# Patient Record
Sex: Male | Born: 1945 | Race: Black or African American | Hispanic: No | Marital: Married | Smoking: Never smoker
Health system: Southern US, Community
[De-identification: ages and names within clinical notes are randomized; demographics above are authoritative.]

## PROBLEM LIST (undated history)

## (undated) DIAGNOSIS — I10 Essential (primary) hypertension: Secondary | ICD-10-CM

## (undated) DIAGNOSIS — K219 Gastro-esophageal reflux disease without esophagitis: Secondary | ICD-10-CM

## (undated) DIAGNOSIS — Z9119 Patient's noncompliance with other medical treatment and regimen: Secondary | ICD-10-CM

## (undated) DIAGNOSIS — M069 Rheumatoid arthritis, unspecified: Secondary | ICD-10-CM

## (undated) DIAGNOSIS — J40 Bronchitis, not specified as acute or chronic: Secondary | ICD-10-CM

## (undated) DIAGNOSIS — Z91199 Patient's noncompliance with other medical treatment and regimen due to unspecified reason: Secondary | ICD-10-CM

## (undated) HISTORY — PX: ABDOMINAL SURGERY: SHX537

---

## 2000-11-15 ENCOUNTER — Emergency Department (HOSPITAL_COMMUNITY): Admission: EM | Admit: 2000-11-15 | Discharge: 2000-11-15 | Payer: Self-pay | Admitting: *Deleted

## 2001-05-21 ENCOUNTER — Emergency Department (HOSPITAL_COMMUNITY): Admission: EM | Admit: 2001-05-21 | Discharge: 2001-05-21 | Payer: Self-pay | Admitting: Emergency Medicine

## 2001-05-24 ENCOUNTER — Encounter: Payer: Self-pay | Admitting: Family Medicine

## 2001-05-24 ENCOUNTER — Ambulatory Visit (HOSPITAL_COMMUNITY): Admission: RE | Admit: 2001-05-24 | Discharge: 2001-05-24 | Payer: Self-pay | Admitting: Family Medicine

## 2001-07-06 ENCOUNTER — Emergency Department (HOSPITAL_COMMUNITY): Admission: EM | Admit: 2001-07-06 | Discharge: 2001-07-06 | Payer: Self-pay | Admitting: Emergency Medicine

## 2001-07-07 ENCOUNTER — Emergency Department (HOSPITAL_COMMUNITY): Admission: EM | Admit: 2001-07-07 | Discharge: 2001-07-07 | Payer: Self-pay | Admitting: *Deleted

## 2001-09-07 ENCOUNTER — Encounter: Payer: Self-pay | Admitting: Emergency Medicine

## 2001-09-07 ENCOUNTER — Emergency Department (HOSPITAL_COMMUNITY): Admission: EM | Admit: 2001-09-07 | Discharge: 2001-09-07 | Payer: Self-pay | Admitting: Emergency Medicine

## 2001-11-09 ENCOUNTER — Ambulatory Visit (HOSPITAL_COMMUNITY): Admission: RE | Admit: 2001-11-09 | Discharge: 2001-11-09 | Payer: Self-pay | Admitting: Family Medicine

## 2001-11-09 ENCOUNTER — Encounter: Payer: Self-pay | Admitting: Family Medicine

## 2002-02-23 ENCOUNTER — Emergency Department (HOSPITAL_COMMUNITY): Admission: EM | Admit: 2002-02-23 | Discharge: 2002-02-23 | Payer: Self-pay | Admitting: Internal Medicine

## 2002-02-23 ENCOUNTER — Encounter: Payer: Self-pay | Admitting: Internal Medicine

## 2002-03-21 ENCOUNTER — Encounter (HOSPITAL_COMMUNITY): Admission: RE | Admit: 2002-03-21 | Discharge: 2002-04-20 | Payer: Self-pay | Admitting: Internal Medicine

## 2002-03-21 ENCOUNTER — Encounter: Payer: Self-pay | Admitting: Internal Medicine

## 2002-11-02 ENCOUNTER — Ambulatory Visit (HOSPITAL_COMMUNITY): Admission: RE | Admit: 2002-11-02 | Discharge: 2002-11-02 | Payer: Self-pay | Admitting: Family Medicine

## 2002-11-02 ENCOUNTER — Encounter: Payer: Self-pay | Admitting: Family Medicine

## 2002-12-22 ENCOUNTER — Emergency Department (HOSPITAL_COMMUNITY): Admission: EM | Admit: 2002-12-22 | Discharge: 2002-12-22 | Payer: Self-pay | Admitting: Emergency Medicine

## 2003-02-04 ENCOUNTER — Ambulatory Visit (HOSPITAL_COMMUNITY): Admission: RE | Admit: 2003-02-04 | Discharge: 2003-02-04 | Payer: Self-pay | Admitting: Family Medicine

## 2003-03-04 ENCOUNTER — Ambulatory Visit (HOSPITAL_COMMUNITY): Admission: RE | Admit: 2003-03-04 | Discharge: 2003-03-04 | Payer: Self-pay | Admitting: Family Medicine

## 2004-08-02 ENCOUNTER — Emergency Department (HOSPITAL_COMMUNITY): Admission: EM | Admit: 2004-08-02 | Discharge: 2004-08-03 | Payer: Self-pay | Admitting: Emergency Medicine

## 2005-07-13 ENCOUNTER — Ambulatory Visit (HOSPITAL_COMMUNITY): Admission: RE | Admit: 2005-07-13 | Discharge: 2005-07-13 | Payer: Self-pay | Admitting: Orthopaedic Surgery

## 2005-07-22 ENCOUNTER — Ambulatory Visit (HOSPITAL_COMMUNITY): Admission: RE | Admit: 2005-07-22 | Discharge: 2005-07-22 | Payer: Self-pay | Admitting: Orthopaedic Surgery

## 2005-07-22 ENCOUNTER — Encounter (INDEPENDENT_AMBULATORY_CARE_PROVIDER_SITE_OTHER): Payer: Self-pay | Admitting: Specialist

## 2007-01-06 ENCOUNTER — Ambulatory Visit (HOSPITAL_COMMUNITY): Admission: RE | Admit: 2007-01-06 | Discharge: 2007-01-06 | Payer: Self-pay | Admitting: General Surgery

## 2007-07-30 ENCOUNTER — Emergency Department (HOSPITAL_COMMUNITY): Admission: EM | Admit: 2007-07-30 | Discharge: 2007-07-30 | Payer: Self-pay | Admitting: Emergency Medicine

## 2007-10-02 ENCOUNTER — Emergency Department (HOSPITAL_COMMUNITY): Admission: EM | Admit: 2007-10-02 | Discharge: 2007-10-02 | Payer: Self-pay | Admitting: Emergency Medicine

## 2008-07-05 ENCOUNTER — Emergency Department (HOSPITAL_COMMUNITY): Admission: EM | Admit: 2008-07-05 | Discharge: 2008-07-06 | Payer: Self-pay | Admitting: Emergency Medicine

## 2008-10-28 ENCOUNTER — Ambulatory Visit (HOSPITAL_COMMUNITY): Admission: RE | Admit: 2008-10-28 | Discharge: 2008-10-28 | Payer: Self-pay | Admitting: General Surgery

## 2008-10-28 ENCOUNTER — Encounter (INDEPENDENT_AMBULATORY_CARE_PROVIDER_SITE_OTHER): Payer: Self-pay | Admitting: General Surgery

## 2008-11-09 ENCOUNTER — Emergency Department (HOSPITAL_COMMUNITY): Admission: EM | Admit: 2008-11-09 | Discharge: 2008-11-09 | Payer: Self-pay | Admitting: Emergency Medicine

## 2008-11-21 ENCOUNTER — Emergency Department (HOSPITAL_COMMUNITY): Admission: EM | Admit: 2008-11-21 | Discharge: 2008-11-21 | Payer: Self-pay | Admitting: Emergency Medicine

## 2009-05-03 ENCOUNTER — Emergency Department (HOSPITAL_COMMUNITY): Admission: EM | Admit: 2009-05-03 | Discharge: 2009-05-03 | Payer: Self-pay | Admitting: Emergency Medicine

## 2009-05-07 ENCOUNTER — Emergency Department (HOSPITAL_COMMUNITY): Admission: EM | Admit: 2009-05-07 | Discharge: 2009-05-07 | Payer: Self-pay | Admitting: Emergency Medicine

## 2009-05-09 ENCOUNTER — Ambulatory Visit (HOSPITAL_COMMUNITY): Admission: RE | Admit: 2009-05-09 | Discharge: 2009-05-09 | Payer: Self-pay | Admitting: Family Medicine

## 2009-05-23 ENCOUNTER — Emergency Department (HOSPITAL_COMMUNITY): Admission: EM | Admit: 2009-05-23 | Discharge: 2009-05-23 | Payer: Self-pay | Admitting: Emergency Medicine

## 2010-02-13 ENCOUNTER — Emergency Department (HOSPITAL_COMMUNITY): Admission: EM | Admit: 2010-02-13 | Discharge: 2010-02-13 | Payer: Self-pay | Admitting: Emergency Medicine

## 2010-02-17 ENCOUNTER — Ambulatory Visit: Payer: Self-pay | Admitting: Cardiology

## 2010-02-17 ENCOUNTER — Observation Stay (HOSPITAL_COMMUNITY)
Admission: AD | Admit: 2010-02-17 | Discharge: 2010-02-19 | Payer: Self-pay | Source: Home / Self Care | Admitting: Family Medicine

## 2010-02-18 ENCOUNTER — Encounter (INDEPENDENT_AMBULATORY_CARE_PROVIDER_SITE_OTHER): Payer: Self-pay | Admitting: Family Medicine

## 2010-06-01 LAB — GLUCOSE, CAPILLARY
Glucose-Capillary: 128 mg/dL — ABNORMAL HIGH (ref 70–99)
Glucose-Capillary: 184 mg/dL — ABNORMAL HIGH (ref 70–99)
Glucose-Capillary: 190 mg/dL — ABNORMAL HIGH (ref 70–99)

## 2010-06-01 LAB — LIPID PANEL
Cholesterol: 166 mg/dL (ref 0–200)
HDL: 41 mg/dL (ref >39)
LDL Cholesterol: 106 mg/dL — ABNORMAL HIGH (ref 0–99)
Total CHOL/HDL Ratio: 4 RATIO
Triglycerides: 93 mg/dL (ref <150)
VLDL: 19 mg/dL (ref 0–40)

## 2010-06-02 LAB — DIFFERENTIAL
Basophils Absolute: 0 10*3/uL (ref 0.0–0.1)
Basophils Absolute: 0 10*3/uL (ref 0.0–0.1)
Basophils Relative: 0 % (ref 0–1)
Eosinophils Absolute: 0.1 10*3/uL (ref 0.0–0.7)
Eosinophils Absolute: 0.1 10*3/uL (ref 0.0–0.7)
Eosinophils Relative: 2 % (ref 0–5)
Eosinophils Relative: 3 % (ref 0–5)
Lymphocytes Relative: 32 % (ref 12–46)
Lymphocytes Relative: 40 % (ref 12–46)
Lymphs Abs: 1.6 10*3/uL (ref 0.7–4.0)
Lymphs Abs: 2 10*3/uL (ref 0.7–4.0)
Monocytes Absolute: 0.3 10*3/uL (ref 0.1–1.0)
Monocytes Absolute: 0.3 10*3/uL (ref 0.1–1.0)
Monocytes Relative: 7 % (ref 3–12)
Neutro Abs: 2.8 10*3/uL (ref 1.7–7.7)
Neutrophils Relative %: 58 % (ref 43–77)

## 2010-06-02 LAB — URINE CULTURE
Culture  Setup Time: 201111301230
Special Requests: POSITIVE

## 2010-06-02 LAB — CK TOTAL AND CKMB (NOT AT ARMC)
CK, MB: 1.5 ng/mL (ref 0.3–4.0)
CK, MB: 1.7 ng/mL (ref 0.3–4.0)
Relative Index: 1.2 (ref 0.0–2.5)
Relative Index: 1.3 (ref 0.0–2.5)
Total CK: 121 U/L (ref 7–232)
Total CK: 128 U/L (ref 7–232)

## 2010-06-02 LAB — CBC
HCT: 38.5 % — ABNORMAL LOW (ref 39.0–52.0)
Hemoglobin: 13.5 g/dL (ref 13.0–17.0)
Hemoglobin: 13.7 g/dL (ref 13.0–17.0)
MCH: 27 pg (ref 26.0–34.0)
MCH: 27.2 pg (ref 26.0–34.0)
MCHC: 35.6 g/dL (ref 30.0–36.0)
MCV: 76.5 fL — ABNORMAL LOW (ref 78.0–100.0)
Platelets: 196 10*3/uL (ref 150–400)
Platelets: 198 10*3/uL (ref 150–400)
RBC: 5 MIL/uL (ref 4.22–5.81)
RBC: 5.03 MIL/uL (ref 4.22–5.81)
RDW: 15 % (ref 11.5–15.5)
WBC: 4.8 10*3/uL (ref 4.0–10.5)
WBC: 4.9 10*3/uL (ref 4.0–10.5)

## 2010-06-02 LAB — TROPONIN I
Troponin I: 0.01 ng/mL (ref 0.00–0.06)
Troponin I: <0.01 ng/mL (ref 0.00–0.06)

## 2010-06-02 LAB — BASIC METABOLIC PANEL
BUN: 19 mg/dL (ref 6–23)
CO2: 27 mEq/L (ref 19–32)
Calcium: 9.6 mg/dL (ref 8.4–10.5)
Chloride: 104 mEq/L (ref 96–112)
Creatinine, Ser: 0.89 mg/dL (ref 0.4–1.5)
GFR calc Af Amer: >60 mL/min (ref >60)
GFR calc non Af Amer: >60 mL/min (ref >60)
Glucose, Bld: 224 mg/dL — ABNORMAL HIGH (ref 70–99)
Potassium: 4.2 mEq/L (ref 3.5–5.1)
Sodium: 135 mEq/L (ref 135–145)

## 2010-06-02 LAB — URINALYSIS, ROUTINE W REFLEX MICROSCOPIC
Bilirubin Urine: NEGATIVE
Glucose, UA: NEGATIVE mg/dL
Hgb urine dipstick: NEGATIVE
Ketones, ur: NEGATIVE mg/dL
Leukocytes, UA: NEGATIVE
Nitrite: POSITIVE — AB
Protein, ur: NEGATIVE mg/dL
Specific Gravity, Urine: 1.025 (ref 1.005–1.030)
Urobilinogen, UA: 0.2 mg/dL (ref 0.0–1.0)
pH: 5 (ref 5.0–8.0)

## 2010-06-02 LAB — CARDIAC PANEL(CRET KIN+CKTOT+MB+TROPI)
CK, MB: 1.2 ng/mL (ref 0.3–4.0)
CK, MB: 1.4 ng/mL (ref 0.3–4.0)
Relative Index: 1.3 (ref 0.0–2.5)
Relative Index: INVALID (ref 0.0–2.5)
Total CK: 106 U/L (ref 7–232)
Troponin I: <0.01 ng/mL (ref 0.00–0.06)
Troponin I: <0.01 ng/mL (ref 0.00–0.06)

## 2010-06-02 LAB — GLUCOSE, CAPILLARY
Glucose-Capillary: 262 mg/dL — ABNORMAL HIGH (ref 70–99)
Glucose-Capillary: 84 mg/dL (ref 70–99)

## 2010-06-02 LAB — COMPREHENSIVE METABOLIC PANEL
ALT: 14 U/L (ref 0–53)
AST: 17 U/L (ref 0–37)
Albumin: 4.1 g/dL (ref 3.5–5.2)
CO2: 30 mEq/L (ref 19–32)
Chloride: 101 mEq/L (ref 96–112)
Creatinine, Ser: 0.97 mg/dL (ref 0.4–1.5)
GFR calc Af Amer: >60 mL/min (ref >60)
Sodium: 140 mEq/L (ref 135–145)
Total Bilirubin: 1 mg/dL (ref 0.3–1.2)

## 2010-06-02 LAB — URINE MICROSCOPIC-ADD ON

## 2010-06-02 LAB — D-DIMER, QUANTITATIVE: D-Dimer, Quant: 0.22 ug/mL-FEU (ref 0.00–0.48)

## 2010-06-10 LAB — BASIC METABOLIC PANEL
BUN: 13 mg/dL (ref 6–23)
BUN: 17 mg/dL (ref 6–23)
Creatinine, Ser: 1.09 mg/dL (ref 0.4–1.5)
GFR calc Af Amer: >60 mL/min (ref >60)
GFR calc non Af Amer: >60 mL/min (ref >60)
GFR calc non Af Amer: >60 mL/min (ref >60)
Potassium: 3.6 mEq/L (ref 3.5–5.1)
Potassium: 3.9 mEq/L (ref 3.5–5.1)
Sodium: 140 mEq/L (ref 135–145)

## 2010-06-10 LAB — CBC
HCT: 39.4 % (ref 39.0–52.0)
Hemoglobin: 13.2 g/dL (ref 13.0–17.0)
Platelets: 184 10*3/uL (ref 150–400)
Platelets: 234 10*3/uL (ref 150–400)
RBC: 4.81 MIL/uL (ref 4.22–5.81)
WBC: 5.3 10*3/uL (ref 4.0–10.5)
WBC: 7.5 10*3/uL (ref 4.0–10.5)

## 2010-06-10 LAB — DIFFERENTIAL
Eosinophils Absolute: 0.5 10*3/uL (ref 0.0–0.7)
Lymphocytes Relative: 29 % (ref 12–46)
Lymphs Abs: 2.1 10*3/uL (ref 0.7–4.0)
Lymphs Abs: 2.1 10*3/uL (ref 0.7–4.0)
Neutro Abs: 2.4 10*3/uL (ref 1.7–7.7)
Neutro Abs: 3.5 10*3/uL (ref 1.7–7.7)
Neutrophils Relative %: 44 % (ref 43–77)
Neutrophils Relative %: 47 % (ref 43–77)

## 2010-06-10 LAB — CK TOTAL AND CKMB (NOT AT ARMC): Relative Index: 0.7 (ref 0.0–2.5)

## 2010-06-10 LAB — URINALYSIS, ROUTINE W REFLEX MICROSCOPIC
Ketones, ur: NEGATIVE mg/dL
Nitrite: NEGATIVE
Protein, ur: NEGATIVE mg/dL
pH: 5 (ref 5.0–8.0)

## 2010-06-10 LAB — BRAIN NATRIURETIC PEPTIDE: Pro B Natriuretic peptide (BNP): <30 pg/mL (ref 0.0–100.0)

## 2010-06-10 LAB — TROPONIN I: Troponin I: 0.02 ng/mL (ref 0.00–0.06)

## 2010-06-14 LAB — COMPREHENSIVE METABOLIC PANEL WITH GFR
ALT: 14 U/L (ref 0–53)
AST: 16 U/L (ref 0–37)
Albumin: 3.6 g/dL (ref 3.5–5.2)
Chloride: 103 meq/L (ref 96–112)
Creatinine, Ser: 0.93 mg/dL (ref 0.4–1.5)
GFR calc Af Amer: >60 mL/min (ref >60)
Sodium: 142 meq/L (ref 135–145)
Total Bilirubin: 0.6 mg/dL (ref 0.3–1.2)

## 2010-06-14 LAB — CBC
HCT: 39.7 % (ref 39.0–52.0)
Hemoglobin: 13.5 g/dL (ref 13.0–17.0)
MCHC: 34 g/dL (ref 30.0–36.0)
MCV: 84.2 fL (ref 78.0–100.0)
Platelets: 200 K/uL (ref 150–400)
RBC: 4.72 MIL/uL (ref 4.22–5.81)
RDW: 16 % — ABNORMAL HIGH (ref 11.5–15.5)
WBC: 7.7 10*3/uL (ref 4.0–10.5)

## 2010-06-14 LAB — COMPREHENSIVE METABOLIC PANEL
Alkaline Phosphatase: 80 U/L (ref 39–117)
BUN: 18 mg/dL (ref 6–23)
CO2: 31 mEq/L (ref 19–32)
Calcium: 8.8 mg/dL (ref 8.4–10.5)
GFR calc non Af Amer: >60 mL/min (ref >60)
Glucose, Bld: 177 mg/dL — ABNORMAL HIGH (ref 70–99)
Potassium: 3.4 mEq/L — ABNORMAL LOW (ref 3.5–5.1)
Total Protein: 6.1 g/dL (ref 6.0–8.3)

## 2010-06-14 LAB — DIFFERENTIAL
Basophils Absolute: 0 K/uL (ref 0.0–0.1)
Basophils Relative: 1 % (ref 0–1)
Eosinophils Absolute: 0.1 10*3/uL (ref 0.0–0.7)
Eosinophils Relative: 1 % (ref 0–5)
Lymphocytes Relative: 27 % (ref 12–46)
Lymphs Abs: 2.1 K/uL (ref 0.7–4.0)
Monocytes Absolute: 0.5 K/uL (ref 0.1–1.0)
Monocytes Relative: 6 % (ref 3–12)
Neutro Abs: 5 10*3/uL (ref 1.7–7.7)
Neutrophils Relative %: 65 % (ref 43–77)

## 2010-06-26 LAB — SYNOVIAL CELL COUNT + DIFF, W/ CRYSTALS
Eosinophils-Synovial: 0 % (ref 0–1)
Monocyte-Macrophage-Synovial Fluid: 14 % — ABNORMAL LOW (ref 50–90)
Neutrophil, Synovial: 74 % — ABNORMAL HIGH (ref 0–25)
WBC, Synovial: 6900 /mm3 — ABNORMAL HIGH (ref 0–200)

## 2010-06-26 LAB — GRAM STAIN

## 2010-06-27 LAB — BASIC METABOLIC PANEL
Calcium: 9.4 mg/dL (ref 8.4–10.5)
Creatinine, Ser: 0.86 mg/dL (ref 0.4–1.5)
GFR calc Af Amer: >60 mL/min (ref >60)

## 2010-06-27 LAB — GLUCOSE, CAPILLARY
Glucose-Capillary: 173 mg/dL — ABNORMAL HIGH (ref 70–99)
Glucose-Capillary: 59 mg/dL — ABNORMAL LOW (ref 70–99)
Glucose-Capillary: 63 mg/dL — ABNORMAL LOW (ref 70–99)

## 2010-06-27 LAB — CBC
MCHC: 34.1 g/dL (ref 30.0–36.0)
RBC: 4.62 MIL/uL (ref 4.22–5.81)
WBC: 4.7 10*3/uL (ref 4.0–10.5)

## 2010-08-04 NOTE — H&P (Signed)
NAME:  [name], [name] NO.:  [id number]   MEDICAL RECORD NO.:  [id number]          PATIENT TYPE:  AMB   LOCATION:  DAY                           FACILITY:  [facility]   PHYSICIAN:  Dalia Heading, M.D.  DATE OF BIRTH:  November 20, 1945   DATE OF ADMISSION:  DATE OF DISCHARGE:  LH                              HISTORY & PHYSICAL   CHIEF COMPLAINT:  Umbilical hernia.   HISTORY OF PRESENT ILLNESS:  The patient is a 65 year old black male who  was referred for evaluation and treatment of an umbilical hernia. It has  been present for some time, the periumbilical pain is made worse with  straining.  No nausea or vomiting have been noted.   PAST MEDICAL HISTORY:  Non-insulin-dependent diabetes mellitus,  hypertension, asthma, mild obesity.   PAST SURGICAL HISTORY:  Excision of mass, scalp in 2000, knee surgery in  February 2000, tonsillectomy in the remote past.   CURRENT MEDICATIONS:  Glipizide, Percocet, enalapril, Caduet, Nexium,  baby aspirin, Klor-Con, Januvia, metformin.   ALLERGIES:  No known drug allergies.   REVIEW OF SYSTEMS:  The patient denies drinking or smoking.  He denies  any recent chest pain, MI, CVA, or bleeding disorders.   PHYSICAL EXAMINATION:  The patient is a well-developed, well-nourished  black male in no acute distress.  LUNGS:  Clear to auscultation with equal breath sounds bilaterally.  HEART:  Examination reveals a regular rate and rhythm without S3,  S4,  or murmurs.  ABDOMEN:  The abdomen is soft and nondistended.  Tender in the umbilicus  with a small hernia present.  It is easily reducible.  No  hepatosplenomegaly or masses are noted.   IMPRESSION:  1. Umbilical hernia.  2. Hypertension.  3. Non insulin-dependent diabetes mellitus.  4. Reflux disease.  5. Asthma.   PLAN:  The patient is scheduled to undergo an umbilical herniorrhaphy on  January 06, 2007.  The risks and benefits of the procedure including  bleeding, infection, and  the possibility of recurrence of the hernia  were fully explained to the patient, who gave informed consent.      Dalia Heading, M.D.  Electronically Signed     MAJ/MEDQ  D:  12/22/2006  T:  12/23/2006  Job:  [id number]   cc:   Mila Homer. Sudie Bailey, M.D.  Fax: [phone]

## 2010-08-04 NOTE — Op Note (Signed)
NAME:  [name], [name] NO.:  [id number]   MEDICAL RECORD NO.:  [id number]          PATIENT TYPE:  AMB   LOCATION:  DAY                           FACILITY:  [facility]   PHYSICIAN:  Tilford Pillar, MD      DATE OF BIRTH:  1945/07/19   DATE OF PROCEDURE:  10/28/2008  DATE OF DISCHARGE:  10/28/2008                               OPERATIVE REPORT   PREOPERATIVE DIAGNOSIS:  Lipoma of left occipital scalp.   POSTOPERATIVE DIAGNOSIS:  Lipoma of left occipital scalp.   PROCEDURE:  Excision of lipoma via a 2 cm incision.   SURGEON:  Tilford Pillar, MD   ANESTHESIA:  General endotracheal, local anesthetic 0.5% Sensorcaine  plain.   SPECIMEN:  Lipoma.   ESTIMATED BLOOD LOSS:  Less than 100 mL.   COMPLICATIONS:  None.   INDICATIONS:  The patient is a 65 year old male, who presented to my  office on referral with the soft tissue lump on the posterior aspect of  his head.  This has been slowly increasing in size.  He had had a  previous scalp lipoma excised in the past by my partner.  On evaluation,  this was suggestive of a lipoma.  The risks, benefits, and alternatives  of excision were discussed at length with the patient including, but not  limited to the risk of bleeding and infection as well as recurrence.  The patient's questions and concerns were addressed and the patient was  consented for the planned procedure.   OPERATION:  The patient taken to the operating room and was placed in  supine position on the operating table at which time the general  anesthetic was administered.  The patient was asleep.  He is  endotracheally intubated by anesthesia.  At this point, his abdomen has  been repositioned into a right lateral decubitus position.  His scalp  was prepped with Betadine solution and draped in standard fashion.  A  local anesthetic was instilled.  A elliptical skin incision was created  over the palpable soft tissue mass.  Additional dissection down through  the subcuticular tissues carried out using electrocautery using the  needle tip Bovie.  At this time, the soft tissue masses dissected free,  circumferentially as placed on the back table, and sent as a permanent  specimen to pathology.  At this time, hemostasis was obtained using  combination of electrocautery as well as a 3-0 Vicryl suture ligature.  The wound was irrigated.  A 3-0 Vicryl was then utilized to  reapproximate deep to the subcuticular tissue and a 4-0 Monocryl was  utilized to reapproximate the skin in a running subcuticular suture.  The skin was washed and dried with moist dry towel.  Benzoin was applied  around the incision.  Half inch Steri-Strips were placed.  The drapes  were removed.  The patient was positioned back into a supine position of  adequate amount of general anesthetic.  He was extubated  and transferred back to regular hospital bed and transferred to the Post-  Anesthetic Care Unit in stable condition.  At the conclusion of the  procedure,  all instrument, sponge, and needle counts were correct.  The  patient tolerated the procedure extremely well.      Tilford Pillar, MD  Electronically Signed     BZ/MEDQ  D:  10/28/2008  T:  10/29/2008  Job:  [id number]

## 2010-08-04 NOTE — Op Note (Signed)
NAME:  [name], [name] NO.:  [id number]   MEDICAL RECORD NO.:  [id number]          PATIENT TYPE:  AMB   LOCATION:  DAY                           FACILITY:  [facility]   PHYSICIAN:  Dalia Heading, M.D.  DATE OF BIRTH:  [birth year]   DATE OF PROCEDURE:  01/06/2007  DATE OF DISCHARGE:                               OPERATIVE REPORT   PREOPERATIVE DIAGNOSIS:  Umbilical hernia.   POSTOPERATIVE DIAGNOSIS:  Umbilical hernia.   PROCEDURE:  Umbilical herniorrhaphy with mesh.   SURGEON:  Dalia Heading, M.D.   ANESTHESIA:  General.   INDICATIONS:  The patient is a 65 year old black male who presents with  a symptomatic umbilical hernia.  The risks and benefits of the procedure  including bleeding, infection, and the possibility of recurrence of the  hernia were fully explained to the patient who gave informed consent.   PROCEDURE NOTE:  The patient was placed in the supine position.  After  general anesthesia was administered due to a failed spinal anesthetic,  the abdomen was prepped and draped using the usual sterile technique  with Betadine.  Surgical site confirmation was performed.   An infraumbilical incision was made down to the fascia.  The umbilicus  was freed away from the underlying fascia.  A 1-cm hernia defect was  found.  A small-sized Ventralex mesh patch was then placed in this  region and secured to the fascia using 2-0 Novofil interrupted sutures.  The fascial edges on the lateral aspects were reapproximated using a 2-0  Novofil interrupted suture.  The base of the umbilicus was secured back  to the fascia using a 2-0 Vicryl interrupted suture.  The subcutaneous  layer was reapproximated using a 3-0 Vicryl interrupted suture.  The  skin was closed using staples.  0.5% Sensorcaine was instilled into the  surrounding wound.  Betadine ointment and dry sterile dressings were  applied.   All tape and needle counts were correct at the end of the procedure.  The patient was awakened and transferred to the PACU in stable  condition.   COMPLICATIONS:  None.   SPECIMEN:  None.   BLOOD LOSS:  Minimal.      Dalia Heading, M.D.  Electronically Signed     MAJ/MEDQ  D:  01/06/2007  T:  01/08/2007  Job:  [id number]   cc:   Mila Homer. Sudie Bailey, M.D.  Fax: [phone]

## 2010-08-04 NOTE — H&P (Signed)
NAME:  [name], ABED SCHAR NO.:  [id number]   MEDICAL RECORD NO.:  [id number]          PATIENT TYPE:  AMB   LOCATION:  DAY                           FACILITY:  [facility]   PHYSICIAN:  Dalia Heading, M.D.  DATE OF BIRTH:  03/04/1946   DATE OF ADMISSION:  DATE OF DISCHARGE:  LH                              HISTORY & PHYSICAL   CHIEF COMPLAINT:  Umbilical hernia.   HISTORY OF PRESENT ILLNESS:  The patient is a 65 year old black male who  was referred for evaluation and treatment of an umbilical hernia. It has  been present for some time, the periumbilical pain is made worse with  straining.  No nausea or vomiting have been noted.   PAST MEDICAL HISTORY:  Non-insulin-dependent diabetes mellitus,  hypertension, asthma, mild obesity.   PAST SURGICAL HISTORY:  Excision of mass, scalp in 2000, knee surgery in  February 2000, tonsillectomy in the remote past.   CURRENT MEDICATIONS:  Glipizide, Percocet, enalapril, Caduet, Nexium,  baby aspirin, Klor-Con, Januvia, metformin.   ALLERGIES:  No known drug allergies.   REVIEW OF SYSTEMS:  The patient denies drinking or smoking.  He denies  any recent chest pain, MI, CVA, or bleeding disorders.   PHYSICAL EXAMINATION:  The patient is a well-developed, well-nourished  black male in no acute distress.  LUNGS:  Clear to auscultation with equal breath sounds bilaterally.  HEART:  Examination reveals a regular rate and rhythm without S3,  S4,  or murmurs.  ABDOMEN:  The abdomen is soft and nondistended.  Tender in the umbilicus  with a small hernia present.  It is easily reducible.  No  hepatosplenomegaly or masses are noted.   IMPRESSION:  1. Umbilical hernia.  2. Hypertension.  3. Non insulin-dependent diabetes mellitus.  4. Reflux disease.  5. Asthma.   PLAN:  The patient is scheduled to undergo an umbilical herniorrhaphy on  January 06, 2007.  The risks and benefits of the procedure including  bleeding, infection, and  the possibility of recurrence of the hernia  were fully explained to the patient, who gave informed consent.      Dalia Heading, M.D.     MAJ/MEDQ  D:  12/22/2006  T:  12/23/2006  Job:  [id number]   cc:   Mila Homer. Sudie Bailey, M.D.  Fax: [phone]

## 2010-08-07 NOTE — Op Note (Signed)
NAME:  [name], [name] NO.:  [id number]   MEDICAL RECORD NO.:  [id number]          PATIENT TYPE:  AMB   LOCATION:  DAY                           FACILITY:  [facility]   PHYSICIAN:  J. Darreld Mclean, M.D. DATE OF BIRTH:  09-21-1945   DATE OF PROCEDURE:  07/22/2005  DATE OF DISCHARGE:                                 OPERATIVE REPORT   PREOPERATIVE DIAGNOSIS:  1.  Tear of medial and lateral meniscus, right knee.  2.  Anterior cruciate ligament deficient knee.  3.  Degenerative joint disease.   POSTOPERATIVE DIAGNOSIS:  1.  Tear of medial and lateral meniscus, right knee.  2.  Anterior cruciate ligament deficient knee.  3.  Degenerative joint disease.   PROCEDURE:  1.  Arthroscopy of the right knee.  2.  Partial medial and lateral meniscectomy,  3.  Debridement.   ANESTHESIA:  Spinal.   SURGEON:  J. Darreld Mclean, M.D.   No drains used.   INDICATIONS:  Patient is a 65 year old male with pain and tenderness in his  right knee.  MRI shows tear in medial and lateral meniscus with ACL  questionable deficient knee and a questionable loose body.  Surgery  recommended because his knee is not getting better, it is not responding  well to conservative treatment.  He has had an arthroscopy on the left knee  several years ago by me and did well.   Risks and imponderables of the procedure were discussed preoperatively to  the patient, he appeared to understand the procedure as outlined.  Again, he  has undergone the procedure before on the other side.   DESCRIPTION OF PROCEDURE:  The patient was seen in the holding area.  He  identified the right knee as the correct surgical site.  He placed a marker  on the right knee, I placed a marker on the right knee.  I have talked to  his family.  He was brought back to the operating room and given spinal  anesthesia while supine on the operating room table.  The tourniquet was  deflated right upper thigh, a leg holder attached.   He was prepped and  draped in the usual manner.  At the time, I re-identified [name] as  the patient and the right knee as the correct surgical site.  The leg was  elevated, wrapped circumferentially with an Esmarch bandage, tourniquet  inflated to 300 mmHg, Esmarch bandage removed.  In-flow cannula inserted  medially, leg tagged with Ringers, instilled in the knee by an infusion  pump.  The initial infusion pump did not work, while the tourniquet was  still up we waited for the replacement pump to be brought in, it was brought  in and this took about an extra few minutes but did not significantly  require need to deflate the tourniquet.  New pump worked well and the knee  was instilled with lactated Ringers.  Arthroscope inserted laterally and  knee systematically examined.   FINDINGS:  The patella pouch had significant degenerative joint disease and  significant osteophytes coming off of the femur, particularly medially.  There were changes in the knee consistent with previous steroid injections.  Medially there was worn down part of the meniscus, which was a small flap  tear, most of the meniscus was gone.  There was grade 3-4 changes.  Anterior  cruciate was torn and deficient.  Stump was left.  Laterally, there was  significant grade 4 changes in the knee with loss of all articular  cartilage.  It was bright pink, there was no articular cartilage hardly at  all and laterally there was some portions of meniscus that were left, most  of the meniscus worn out and I smoothed this out with a meniscal shaver.  I  got a good smooth contour.  He has got a significant degenerative joint to  his knee much more laterally, but medially is also very severe.  The patient  has got very severe degenerative joint disease.  He has got very large  __________ surfaces laterally.  Chondral surface patella had grade 3-4  changes.  Significant osteophytes around the patella and also medial and   femoral condyle as stated.  Final pictures were taken.  The knee was  irrigated with lactated Ringers and the wound was reapproximated with #3-0  nylon in an interrupted vertical mattress manner.  Tourniquet deflated after  24 minutes.  A sterile dressing applied, bulky dressing applied.  The  patient to go to recovery in good condition.  Marcaine had been instilled  0.25% at each portal prior to application of the dressings.  He has  significant degenerative joint disease in the knee and will eventually need  a total knee arthroplasty and I will talk to him and the family.  Prescription given for Vicodin ES for pain.  I will see him in the office in  approximately 10 days to 2 weeks.  Physical therapy has been arranged.  If  he has any difficulty, he is to contact me __________.           ______________________________  Shela Commons. Darreld Mclean, M.D.     JWK/MEDQ  D:  07/22/2005  T:  07/22/2005  Job:  [id number]

## 2010-08-07 NOTE — H&P (Signed)
NAME:  [name] NO.:  [id number]   MEDICAL RECORD NO.:  [id number]          PATIENT TYPE:  AMB   LOCATION:  DAY                           FACILITY:  [facility]   PHYSICIAN:  J. Darreld Mclean, M.D. DATE OF BIRTH:  12/20/1945   DATE OF ADMISSION:  07/22/2005  DATE OF DISCHARGE:  LH                                HISTORY & PHYSICAL   CHIEF COMPLAINT:  Right knee pain.   The patient is a 65 year old male with pain and tenderness in his right knee  for several months.  He saw Dr. Sudie Bailey on the 19th complaining of pain and  tenderness in the knee.  There was giving away, popping and bothering him.  He said it felt like the left knee did several years ago.  I did a left knee  arthroscopy on him on 2000.  He was seen in the office on the 23rd.  MRI was  done at the hospital on the 24th.  MRI was markedly positive for extensive  degenerative arthritic changes of the right knee.  There appeared to be a  loose body in the anterior aspect of the tibiofemoral articulation just  inferior to the infrapatellar fat pad and possibly spotted Baker's cyst.  He  had extensive articular cartilage loss with medial and lateral meniscal  tears.  The ACL could not be well-identified.  There was a possibility that  it was completely torn.  He had a prepatellar bursitis as well.  The patient  was informed of the findings.  Surgery has been recommended for the meniscal  tears.  His significant degenerative joint disease may eventually need a  total knee replacement.  He is agreeable to having this surgery.   PAST HISTORY:  Positive for hypertension, heart disease, diabetes, increased  cholesterol.   The patient is currently taking:  1.  Avandia 8 mg daily.  2.  Metformin three a day.  3.  Bayer aspirin 81 mg a day.  4.  Potassium chloride supplement 10 mEq a day.  5.  Lipitor 20 mg one a day.  6.  Lisinopril 30 mg one a day.  7.  Vicodin ES p.r.n. pain.   He does not use alcoholic  beverages.  He does not smoke.  He finished school  through the sixth grade.  Dr. Sudie Bailey is his family doctor.   Other surgery includes kidney stone surgery and foot surgery.   His father is dead from diabetes.  He has a sister that died from cancer.  He lives in [place].   PHYSICAL EXAMINATION:  VITAL SIGNS:  BP is 138/86, pulse 76, respiratory  rate 16, afebrile.  5 feet 9 inches, 202 pounds.  GENERAL:  Alert, cooperative, oriented.  HEENT:  Negative.  NECK:  Supple.  LUNGS:  Lungs clear to P&A.  CARDIAC:  Regular without murmur heard.  ABDOMEN:  Soft and nontender without masses.  EXTREMITIES:  Right knee painful and tender, particularly the medial aspect  of the knee with positive McMurray.  He has a weakly positive drawer sign.  A mild effusion to the knee.  He has pain to the knee and there is crepitus.  The left knee is with full range of motion and with some slight crepitus.  Other extremities within normal limits.  CENTRAL NERVOUS SYSTEM:  Intact.  SKIN:  Intact.   IMPRESSION:  1.  Tear to medial and lateral meniscus of the right knee with possible      anterior cruciate ligament tear.  2.  Degenerative joint disease of the right knee with possible loose body.  3.  Diabetes mellitus.  4.  History of chronic obstructive pulmonary disease.  5.  History of hypertension.  6.  History of diabetes mellitus, oral control.  7.  Hypercholesterolemia.   PLAN:  Arthroscopy of the right knee and meniscectomy.  I discussed with the  patient the planned procedure.  He understands it, has undergone a left knee  in 2000.  Labs are pending.                                            ______________________________  J. Darreld Mclean, M.D.     JWK/MEDQ  D:  07/19/2005  T:  07/19/2005  Job:  [id number]

## 2010-12-17 LAB — DIFFERENTIAL
Lymphocytes Relative: 23
Lymphs Abs: 1.3
Monocytes Absolute: 0.3
Monocytes Relative: 5
Neutro Abs: 4

## 2010-12-17 LAB — URINALYSIS, ROUTINE W REFLEX MICROSCOPIC
Glucose, UA: NEGATIVE
Ketones, ur: NEGATIVE
Protein, ur: NEGATIVE

## 2010-12-17 LAB — COMPREHENSIVE METABOLIC PANEL
Albumin: 3.3 — ABNORMAL LOW
BUN: 12
Calcium: 9.1
Creatinine, Ser: 0.95
Potassium: 3.6
Total Protein: 5.6 — ABNORMAL LOW

## 2010-12-17 LAB — CBC
HCT: 37.3 — ABNORMAL LOW
MCHC: 33.5
Platelets: 181
RDW: 15.2

## 2010-12-30 LAB — BASIC METABOLIC PANEL
CO2: 31
GFR calc Af Amer: >60
Glucose, Bld: 118 — ABNORMAL HIGH
Potassium: 4.1
Sodium: 142

## 2010-12-30 LAB — CBC
HCT: 38.4 — ABNORMAL LOW
Hemoglobin: 13
MCHC: 33.8
RBC: 4.74
RDW: 15.1 — ABNORMAL HIGH

## 2011-04-03 ENCOUNTER — Emergency Department (HOSPITAL_COMMUNITY)
Admission: EM | Admit: 2011-04-03 | Discharge: 2011-04-03 | Disposition: A | Discharge status: Home / Self Care | Payer: Self-pay | Attending: Emergency Medicine | Admitting: Emergency Medicine

## 2011-04-03 ENCOUNTER — Emergency Department (HOSPITAL_COMMUNITY): Payer: Self-pay

## 2011-04-03 ENCOUNTER — Encounter (HOSPITAL_COMMUNITY): Payer: Self-pay

## 2011-04-03 DIAGNOSIS — I10 Essential (primary) hypertension: Secondary | ICD-10-CM | POA: Insufficient documentation

## 2011-04-03 DIAGNOSIS — J40 Bronchitis, not specified as acute or chronic: Secondary | ICD-10-CM | POA: Insufficient documentation

## 2011-04-03 DIAGNOSIS — E119 Type 2 diabetes mellitus without complications: Secondary | ICD-10-CM | POA: Insufficient documentation

## 2011-04-03 HISTORY — DX: Essential (primary) hypertension: I10

## 2011-04-03 MED ORDER — ALBUTEROL SULFATE HFA 108 (90 BASE) MCG/ACT IN AERS
2.0000 | INHALATION_SPRAY | RESPIRATORY_TRACT | Status: DC | PRN
  Administered 2011-04-03: 2 via RESPIRATORY_TRACT
  Filled 2011-04-03: qty 6.7

## 2011-04-03 MED ORDER — GUAIFENESIN-CODEINE 100-10 MG/5ML PO SYRP: ORAL_SOLUTION | ORAL | Status: DC

## 2011-04-03 MED ORDER — HYDROCOD POLST-CHLORPHEN POLST 10-8 MG/5ML PO LQCR
5.0000 mL | Freq: Once | ORAL | Status: AC
  Administered 2011-04-03: 5 mL via ORAL
  Filled 2011-04-03: qty 5

## 2011-04-03 NOTE — ED Provider Notes (Signed)
History     CSN: [id number]  Arrival date & time 04/03/11  1320   None     Chief Complaint  Patient presents with  . Cough    (Consider location/radiation/quality/duration/timing/severity/associated sxs/prior treatment) HPI Comments: Ran out of albuterol Pt of dr. Sudie Bailey   Patient is a 66 y.o. male presenting with cough. The history is provided by the patient. No language interpreter was used.  Cough This is a new problem. The problem occurs every few minutes. The problem has been gradually worsening. The cough is productive of sputum. There has been no fever. Associated symptoms include wheezing. Pertinent negatives include no chills. He has tried nothing for the symptoms. He is not a smoker. His past medical history is significant for bronchitis.    Past Medical History  Diagnosis Date  . Diabetes mellitus   . Hypertension     Past Surgical History  Procedure Date  . Abdominal surgery     No family history on file.  History  Substance Use Topics  . Smoking status: Never Smoker   . Smokeless tobacco: Not on file  . Alcohol Use: No      Review of Systems  Constitutional: Negative for fever and chills.  Respiratory: Positive for cough and wheezing.   All other systems reviewed and are negative.    Allergies  Review of patient's allergies indicates no known allergies.  Home Medications  No current outpatient prescriptions on file.  BP 124/77  Pulse 87  Temp(Src) 98.4 F (36.9 C) (Oral)  Resp 23  Ht 5\' 5"  (1.651 m)  Wt 242 lb (109.77 kg)  BMI 40.27 kg/m2  SpO2 99%  Physical Exam  Nursing note and vitals reviewed. Constitutional: He is oriented to person, place, and time. He appears well-developed and well-nourished.  HENT:  Head: Normocephalic and atraumatic.  Eyes: EOM are normal.  Neck: Normal range of motion.  Cardiovascular: Normal rate, regular rhythm, normal heart sounds and intact distal pulses.   Pulmonary/Chest: Effort normal. No  accessory muscle usage. Not tachypneic. No respiratory distress. He has no decreased breath sounds. He has wheezes in the right upper field, the right middle field, the right lower field, the left upper field, the left middle field and the left lower field. He has no rhonchi. He has no rales. He exhibits no tenderness.  Abdominal: Soft. He exhibits no distension. There is no tenderness.  Musculoskeletal: Normal range of motion.  Neurological: He is alert and oriented to person, place, and time.  Skin: Skin is warm and dry.  Psychiatric: He has a normal mood and affect. Judgment normal.    ED Course  Procedures (including critical care time)  Labs Reviewed - No data to display Dg Chest 2 View  04/03/2011  *RADIOLOGY REPORT*  Clinical Data: Cough, congestion and vomiting.  CHEST - 2 VIEW  Comparison: Single view of the chest 02/13/2010 and PA and lateral chest 05/03/2009.  Findings: Lungs are clear.  Heart size is normal.  Right aortic arch is noted.  No pneumothorax or effusion.  IMPRESSION: No acute finding.  Right aortic arch noted.  Original Report Authenticated By: Bernadene Bell. Maricela Curet, M.D.     No diagnosis found.    MDM          Worthy Rancher, PA 04/03/11 1620

## 2011-04-03 NOTE — ED Notes (Signed)
Pt a/ox4. Resp even and unlabored. NAD at this time. D/C instructions reviewed with pt. Pt verbalized understanding. Pt ambulated to lobby with steady gate.  

## 2011-04-03 NOTE — ED Notes (Signed)
See triage note for c/c.  

## 2011-04-03 NOTE — ED Notes (Signed)
Pt presents with productive cough x 3 weeks. Pt states phlegm is white.

## 2011-04-04 ENCOUNTER — Emergency Department (HOSPITAL_COMMUNITY)
Admission: EM | Admit: 2011-04-04 | Discharge: 2011-04-04 | Disposition: A | Discharge status: Home / Self Care | Payer: Medicare Other | Attending: Emergency Medicine | Admitting: Emergency Medicine

## 2011-04-04 ENCOUNTER — Encounter (HOSPITAL_COMMUNITY): Payer: Self-pay | Admitting: *Deleted

## 2011-04-04 ENCOUNTER — Emergency Department (HOSPITAL_COMMUNITY): Payer: Medicare Other

## 2011-04-04 ENCOUNTER — Other Ambulatory Visit: Payer: Self-pay

## 2011-04-04 DIAGNOSIS — R05 Cough: Secondary | ICD-10-CM | POA: Insufficient documentation

## 2011-04-04 DIAGNOSIS — J40 Bronchitis, not specified as acute or chronic: Secondary | ICD-10-CM | POA: Insufficient documentation

## 2011-04-04 DIAGNOSIS — R059 Cough, unspecified: Secondary | ICD-10-CM | POA: Insufficient documentation

## 2011-04-04 DIAGNOSIS — I440 Atrioventricular block, first degree: Secondary | ICD-10-CM | POA: Insufficient documentation

## 2011-04-04 DIAGNOSIS — I10 Essential (primary) hypertension: Secondary | ICD-10-CM | POA: Insufficient documentation

## 2011-04-04 DIAGNOSIS — Z7982 Long term (current) use of aspirin: Secondary | ICD-10-CM | POA: Insufficient documentation

## 2011-04-04 DIAGNOSIS — E119 Type 2 diabetes mellitus without complications: Secondary | ICD-10-CM | POA: Insufficient documentation

## 2011-04-04 LAB — BASIC METABOLIC PANEL
BUN: 17 mg/dL (ref 6–23)
CO2: 30 mEq/L (ref 19–32)
Chloride: 104 mEq/L (ref 96–112)
Creatinine, Ser: 0.98 mg/dL (ref 0.50–1.35)
GFR calc Af Amer: >90 mL/min (ref >90)
Glucose, Bld: 213 mg/dL — ABNORMAL HIGH (ref 70–99)

## 2011-04-04 LAB — DIFFERENTIAL
Basophils Relative: 0 % (ref 0–1)
Monocytes Absolute: 0.4 10*3/uL (ref 0.1–1.0)
Monocytes Relative: 5 % (ref 3–12)
Neutro Abs: 4.2 10*3/uL (ref 1.7–7.7)

## 2011-04-04 LAB — CBC
HCT: 37.3 % — ABNORMAL LOW (ref 39.0–52.0)
Hemoglobin: 12.7 g/dL — ABNORMAL LOW (ref 13.0–17.0)
MCHC: 34 g/dL (ref 30.0–36.0)

## 2011-04-04 MED ORDER — METHYLPREDNISOLONE SODIUM SUCC 125 MG IJ SOLR
125.0000 mg | Freq: Once | INTRAMUSCULAR | Status: AC
  Administered 2011-04-04: 125 mg via INTRAVENOUS
  Filled 2011-04-04: qty 2

## 2011-04-04 MED ORDER — IPRATROPIUM BROMIDE 0.02 % IN SOLN
0.5000 mg | Freq: Once | RESPIRATORY_TRACT | Status: AC
  Administered 2011-04-04: 0.5 mg via RESPIRATORY_TRACT
  Filled 2011-04-04: qty 2.5

## 2011-04-04 MED ORDER — DOXYCYCLINE HYCLATE 100 MG PO CAPS: 100.0000 mg | ORAL_CAPSULE | Freq: Two times a day (BID) | ORAL | Status: AC

## 2011-04-04 MED ORDER — ALBUTEROL SULFATE (5 MG/ML) 0.5% IN NEBU
5.0000 mg | INHALATION_SOLUTION | Freq: Once | RESPIRATORY_TRACT | Status: AC
  Administered 2011-04-04: 5 mg via RESPIRATORY_TRACT
  Filled 2011-04-04: qty 1

## 2011-04-04 MED ORDER — ALBUTEROL SULFATE (5 MG/ML) 0.5% IN NEBU
2.5000 mg | INHALATION_SOLUTION | Freq: Once | RESPIRATORY_TRACT | Status: AC
  Administered 2011-04-04: 2.5 mg via RESPIRATORY_TRACT
  Filled 2011-04-04: qty 0.5

## 2011-04-04 MED ORDER — PREDNISONE 50 MG PO TABS: ORAL_TABLET | ORAL | Status: AC

## 2011-04-04 NOTE — ED Notes (Signed)
Discharge instructions reviewed with pt, questions answered. Pt verbalized understanding.  

## 2011-04-04 NOTE — ED Provider Notes (Signed)
Medical screening examination/treatment/procedure(s) were performed by non-physician practitioner and as supervising physician I was immediately available for consultation/collaboration.  Flint Melter, MD 04/04/11 [phone]

## 2011-04-04 NOTE — ED Provider Notes (Signed)
History  This chart was scribed for Albert Octave, MD by Bennett Scrape. This patient was seen in room APA09/APA09 and the patient's care was started at 4:37PM.  CSN: [id number]  Arrival date & time 04/04/11  1621   First MD Initiated Contact with Patient 04/04/11 1629      Chief Complaint  Patient presents with  . Cough  . Wheezing   The history is provided by the patient. No language interpreter was used.   [name] is a 66 y.o. male who presents to the Emergency Department complaining of 24 hours of gradual onset, gradually worsening, constant productive coughing of white sputum with associated SOB while walking around, nasal congestion and wheezing. Pt states that the cough started 3 weeks ago. He was seen in this ED yesterday and discharged home. He states that his symptoms are worse today. He denies any modifying factors. He was prescribed Robitussin AC with no improvement in symptoms. He denies chest pain and fever as associated symptoms. He denies any sick contacts at home. He has a h/o diabetes and HTN. He denies smoking and alcohol use.   Pt's PCP is Dr. Sudie Bailey.   Past Medical History  Diagnosis Date  . Diabetes mellitus   . Hypertension     Past Surgical History  Procedure Date  . Abdominal surgery     No family history on file.  History  Substance Use Topics  . Smoking status: Never Smoker   . Smokeless tobacco: Not on file  . Alcohol Use: No      Review of Systems  A complete 10 system review of systems was obtained and is otherwise negative except as noted in the HPI.   Allergies  Review of patient's allergies indicates no known allergies.  Home Medications   Current Outpatient Rx  Name Route Sig Dispense Refill  . ALBUTEROL SULFATE HFA 108 (90 BASE) MCG/ACT IN AERS Inhalation Inhale 2 puffs into the lungs every 6 (six) hours as needed. Shortness of breath    . AMLODIPINE BESYLATE 10 MG PO TABS Oral Take 10 mg by mouth daily.    .  ASPIRIN EC 81 MG PO TBEC Oral Take 81 mg by mouth daily.    . ATORVASTATIN CALCIUM 80 MG PO TABS Oral Take 80 mg by mouth daily.    . ENALAPRIL MALEATE 20 MG PO TABS Oral Take 20 mg by mouth daily.    . FUROSEMIDE 40 MG PO TABS Oral Take 40 mg by mouth daily.    Marland Kitchen GLIPIZIDE 10 MG PO TABS Oral Take 10 mg by mouth daily.    . GUAIFENESIN-CODEINE 100-10 MG/5ML PO SYRP  10 ml po q 4-6 hrs prn cough 240 mL 0  . METFORMIN HCL 500 MG PO TABS Oral Take 500 mg by mouth 2 (two) times daily with a meal.    . OMEPRAZOLE 20 MG PO CPDR Oral Take 20 mg by mouth daily.    Marland Kitchen POTASSIUM CHLORIDE CRYS ER 10 MEQ PO TBCR Oral Take 10 mEq by mouth daily.    Marland Kitchen DOXYCYCLINE HYCLATE 100 MG PO CAPS Oral Take 1 capsule (100 mg total) by mouth 2 (two) times daily. 20 capsule 0  . PREDNISONE 50 MG PO TABS  1 tablet PO daily 4 tablet 0    Triage Vitals: BP 117/74  Pulse 101  Temp 97.7 F (36.5 C)  Resp 24  SpO2 98%  Physical Exam  Nursing note and vitals reviewed. Constitutional: He is oriented to person, place,  and time. He appears well-developed and well-nourished.       Pt is stuttering  HENT:  Head: Normocephalic and atraumatic.       Pharynx is normal  Eyes: Conjunctivae and EOM are normal.  Neck: Normal range of motion. Neck supple.  Cardiovascular: Normal rate, regular rhythm and normal heart sounds.   Pulmonary/Chest: He has wheezes (Diffusely).  Abdominal: Soft. There is no tenderness.  Musculoskeletal: Normal range of motion. He exhibits no edema.  Neurological: He is alert and oriented to person, place, and time. No cranial nerve deficit.  Skin: Skin is warm and dry. No rash noted.  Psychiatric: He has a normal mood and affect. His behavior is normal.    ED Course  Procedures (including critical care time)  DIAGNOSTIC STUDIES: Oxygen Saturation is 98% on [place], normal by my interpretation.    COORDINATION OF CARE: 4:42PM-Discussed treatment plan with pt at bedside and pt agreed to  plan. 5:46PM-Pt states that he feels like something is caught in his throat. Will give another breathing treatment. Wife confirms that pt's stutter is normal. 7:05PM-Pt rechecked and is feeling better. Pt states that he got up and walk around w/o issue. Will prescribe steroids and antibiotics and discharge pt home..   Labs Reviewed  CBC - Abnormal; Notable for the following:    Hemoglobin 12.7 (*)    HCT 37.3 (*)    MCV 77.4 (*)    All other components within normal limits  DIFFERENTIAL - Abnormal; Notable for the following:    Eosinophils Relative 14 (*)    Eosinophils Absolute 1.0 (*)    All other components within normal limits  BASIC METABOLIC PANEL - Abnormal; Notable for the following:    Glucose, Bld 213 (*)    GFR calc non Af Amer 86 (*)    All other components within normal limits  TROPONIN I  D-DIMER, QUANTITATIVE   Dg Chest 2 View  04/04/2011  *RADIOLOGY REPORT*  Clinical Data: Shortness of breath, cough, congestion and fever.  CHEST - 2 VIEW  Comparison: PA and lateral chest 04/03/2011.  Findings: There is peribronchial thickening.  No consolidative process, pneumothorax or effusion.  Heart size normal.  IMPRESSION: Bronchitic change.  No focal process.  Original Report Authenticated By: Bernadene Bell. Maricela Curet, M.D.   Dg Chest 2 View  04/03/2011  *RADIOLOGY REPORT*  Clinical Data: Cough, congestion and vomiting.  CHEST - 2 VIEW  Comparison: Single view of the chest 02/13/2010 and PA and lateral chest 05/03/2009.  Findings: Lungs are clear.  Heart size is normal.  Right aortic arch is noted.  No pneumothorax or effusion.  IMPRESSION: No acute finding.  Right aortic arch noted.  Original Report Authenticated By: Bernadene Bell. D'ALESSIO, M.D.     1. Bronchitis       MDM  Cough, congestion for the past 2 days. Seen yesterday for same complaint. Hypoxic at 87% in triage. Becomes short of breath with walking around. He denies any chest pain, fever abdominal pain, nausea or vomiting.   Diffuse wheezing on exam.  Work of breathing, wheezing improved. Patient ambulatory in the hallways and did not desaturate below 96%. He denies any chest pain, shortness of breath.  Ddimer negative.  We'll treat for bronchitis with steroids and antibiotics continue albuterol and cough medication that he received yesterday. He agrees to call Dr. Sudie Bailey tomorrow morning for an appointment later this week    I personally performed the services described in this documentation, which was scribed in  my presence.  The recorded information has been reviewed and considered.   Date: 04/04/2011  Rate: 91  Rhythm: normal sinus rhythm  QRS Axis: normal  Intervals: PR prolonged  ST/T Wave abnormalities: nonspecific ST/T changes  Conduction Disutrbances:none  Narrative Interpretation:   Old EKG Reviewed: unchanged     Albert Octave, MD 04/04/11 Ernestina [place]

## 2011-04-04 NOTE — ED Notes (Signed)
Pt was seen in here yesterday for cough, sob, was sent home and has gotten worse.

## 2011-04-04 NOTE — ED Notes (Signed)
Respiratory paged

## 2011-04-04 NOTE — ED Notes (Signed)
Patient ambulated around nurse's station with continuous pulse oximetry. Patient O2 sats remained 95-100% while walking. Patient able to ambulate with steady gait.

## 2011-04-21 ENCOUNTER — Emergency Department (HOSPITAL_COMMUNITY)
Admission: EM | Admit: 2011-04-21 | Discharge: 2011-04-21 | Disposition: A | Discharge status: Home / Self Care | Payer: Medicare Other | Attending: Emergency Medicine | Admitting: Emergency Medicine

## 2011-04-21 ENCOUNTER — Encounter (HOSPITAL_COMMUNITY): Payer: Self-pay

## 2011-04-21 DIAGNOSIS — E119 Type 2 diabetes mellitus without complications: Secondary | ICD-10-CM | POA: Insufficient documentation

## 2011-04-21 DIAGNOSIS — I10 Essential (primary) hypertension: Secondary | ICD-10-CM | POA: Insufficient documentation

## 2011-04-21 DIAGNOSIS — M545 Low back pain, unspecified: Secondary | ICD-10-CM | POA: Insufficient documentation

## 2011-04-21 MED ORDER — CYCLOBENZAPRINE HCL 10 MG PO TABS: 10.0000 mg | ORAL_TABLET | Freq: Two times a day (BID) | ORAL | Status: AC | PRN

## 2011-04-21 MED ORDER — OXYCODONE-ACETAMINOPHEN 5-325 MG PO TABS: 2.0000 | ORAL_TABLET | ORAL | Status: AC | PRN

## 2011-04-21 MED ORDER — HYDROMORPHONE HCL PF 2 MG/ML IJ SOLN
2.0000 mg | Freq: Once | INTRAMUSCULAR | Status: AC
  Administered 2011-04-21: 2 mg via INTRAMUSCULAR
  Filled 2011-04-21: qty 1

## 2011-04-21 NOTE — ED Notes (Signed)
Pt stated he is feeling much better, dressed self to lobby in wheelchair
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
## 2011-04-21 NOTE — ED Notes (Signed)
Pt w/ h/o of chronic back pain, denies any recent injury, in er for pain eval
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
## 2011-04-21 NOTE — ED Notes (Signed)
Awaiting md, resting in bed, w/mily members at bedside.  Voided clear yellow in urinal

## 2011-11-03 ENCOUNTER — Encounter (HOSPITAL_COMMUNITY): Payer: Self-pay | Admitting: *Deleted

## 2011-11-03 ENCOUNTER — Emergency Department (HOSPITAL_COMMUNITY): Payer: Medicare Other

## 2011-11-03 ENCOUNTER — Emergency Department (HOSPITAL_COMMUNITY)
Admission: EM | Admit: 2011-11-03 | Discharge: 2011-11-03 | Disposition: A | Discharge status: Home / Self Care | Payer: Medicare Other | Attending: Emergency Medicine | Admitting: Emergency Medicine

## 2011-11-03 DIAGNOSIS — E119 Type 2 diabetes mellitus without complications: Secondary | ICD-10-CM | POA: Insufficient documentation

## 2011-11-03 DIAGNOSIS — S61209A Unspecified open wound of unspecified finger without damage to nail, initial encounter: Secondary | ICD-10-CM | POA: Insufficient documentation

## 2011-11-03 DIAGNOSIS — Y9389 Activity, other specified: Secondary | ICD-10-CM | POA: Insufficient documentation

## 2011-11-03 DIAGNOSIS — IMO0002 Reserved for concepts with insufficient information to code with codable children: Secondary | ICD-10-CM

## 2011-11-03 DIAGNOSIS — Y998 Other external cause status: Secondary | ICD-10-CM | POA: Insufficient documentation

## 2011-11-03 DIAGNOSIS — I10 Essential (primary) hypertension: Secondary | ICD-10-CM | POA: Insufficient documentation

## 2011-11-03 DIAGNOSIS — Z794 Long term (current) use of insulin: Secondary | ICD-10-CM | POA: Insufficient documentation

## 2011-11-03 DIAGNOSIS — W298XXA Contact with other powered powered hand tools and household machinery, initial encounter: Secondary | ICD-10-CM | POA: Insufficient documentation

## 2011-11-03 DIAGNOSIS — Z79899 Other long term (current) drug therapy: Secondary | ICD-10-CM | POA: Insufficient documentation

## 2011-11-03 MED ORDER — HYDROMORPHONE HCL PF 1 MG/ML IJ SOLN
1.0000 mg | Freq: Once | INTRAMUSCULAR | Status: AC
  Administered 2011-11-03: 1 mg via INTRAVENOUS
  Filled 2011-11-03: qty 1

## 2011-11-03 MED ORDER — BUPIVACAINE HCL (PF) 0.5 % IJ SOLN
INTRAMUSCULAR | Status: AC
  Administered 2011-11-03: 13:00:00
  Filled 2011-11-03: qty 30

## 2011-11-03 MED ORDER — TETANUS-DIPHTH-ACELL PERTUSSIS 5-2.5-18.5 LF-MCG/0.5 IM SUSP
0.5000 mL | Freq: Once | INTRAMUSCULAR | Status: AC
  Administered 2011-11-03: 0.5 mL via INTRAMUSCULAR
  Filled 2011-11-03: qty 0.5

## 2011-11-03 MED ORDER — SULFAMETHOXAZOLE-TRIMETHOPRIM 800-160 MG PO TABS: 1.0000 | ORAL_TABLET | Freq: Two times a day (BID) | ORAL | Status: AC

## 2011-11-03 MED ORDER — CEFAZOLIN SODIUM 1-5 GM-% IV SOLN
1.0000 g | Freq: Once | INTRAVENOUS | Status: AC
  Administered 2011-11-03: 1 g via INTRAVENOUS
  Filled 2011-11-03: qty 50

## 2011-11-03 NOTE — ED Provider Notes (Signed)
History  This chart was scribed for Albert Lennert, MD by Ladona Ridgel Day. This patient was seen in room APA15/APA15 and the patient's care was started at 1029.  CSN: [id number]  Arrival date & time 11/03/11  1029   First MD Initiated Contact with Patient 11/03/11 1136      Chief Complaint  Patient presents with  . Laceration   Patient is a 66 y.o. male presenting with skin laceration. The history is provided by the patient. No language interpreter was used.  Laceration  The incident occurred less than 1 hour ago. Pain location: Right index finger. The laceration is 2 cm in size. 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 The pain is mild. The pain has been constant since onset. He reports no foreign bodies present.   [name] is a 66 y.o. male who presents to the Emergency Department complaining of laceration to his right index finger after he cut it while using an electric hedge trimmer this AM. He states minimal pain and mild bleeding which was controlled. He denies any other injuries or illnesses at this time. He states medical history of DM and HTN.  Past Medical History  Diagnosis Date  . Diabetes mellitus   . Hypertension     Past Surgical History  Procedure Date  . Abdominal surgery     History reviewed. No pertinent family history.  History  Substance Use Topics  . Smoking status: Never Smoker   . Smokeless tobacco: Not on file  . Alcohol Use: No      Review of Systems  Constitutional: Negative for fatigue.  HENT: Negative for congestion, sinus pressure and ear discharge.   Eyes: Negative for discharge.  Respiratory: Negative for cough.   Cardiovascular: Negative for chest pain.  Gastrointestinal: Negative for abdominal pain and diarrhea.  Genitourinary: Negative for frequency and hematuria.  Musculoskeletal: Negative for back pain.  Skin: Positive for wound (laceration on his right index finger. ).  Neurological: Negative for  seizures and headaches.  Hematological: Negative.   Psychiatric/Behavioral: Negative for hallucinations.  All other systems reviewed and are negative.    Allergies  Review of patient's allergies indicates no known allergies.  Home Medications   Current Outpatient Rx  Name Route Sig Dispense Refill  . ALBUTEROL SULFATE HFA 108 (90 BASE) MCG/ACT IN AERS Inhalation Inhale 2 puffs into the lungs every 6 (six) hours as needed. Shortness of breath    . AMLODIPINE BESYLATE 10 MG PO TABS Oral Take 10 mg by mouth daily.    . ASPIRIN EC 81 MG PO TBEC Oral Take 81 mg by mouth daily.    . ATORVASTATIN CALCIUM 80 MG PO TABS Oral Take 80 mg by mouth daily.    . ENALAPRIL MALEATE 20 MG PO TABS Oral Take 20 mg by mouth daily.    . FUROSEMIDE 40 MG PO TABS Oral Take 40 mg by mouth daily.    Marland Kitchen GLIPIZIDE 10 MG PO TABS Oral Take 10 mg by mouth daily.    . GUAIFENESIN-CODEINE 100-10 MG/5ML PO SYRP  10 ml po q 4-6 hrs prn cough 240 mL 0  . INSULIN GLARGINE 100 UNIT/ML [place] SOLN Subcutaneous Inject 10-16 Units into the skin at bedtime. Or as directed by MD    . OMEPRAZOLE 20 MG PO CPDR Oral Take 20 mg by mouth daily.    Marland Kitchen POTASSIUM CHLORIDE CRYS ER 10 MEQ PO TBCR Oral Take 10 mEq by mouth daily.  Triage Vitals: BP 151/89  Pulse 71  Temp 97.8 F (36.6 C) (Oral)  Resp 18  Wt 242 lb (109.77 kg)  SpO2 99%  Physical Exam  Nursing note and vitals reviewed. Constitutional: He is oriented to person, place, and time. He appears well-developed.  HENT:  Head: Normocephalic.  Eyes: Conjunctivae are normal.  Neck: No tracheal deviation present.  Cardiovascular:  No murmur heard. Musculoskeletal: Normal range of motion.  Neurological: He is oriented to person, place, and time.  Skin: Skin is warm.       2.5 cm laceration at PIP joint of his left index finger.   Psychiatric: He has a normal mood and affect.    ED Course  LACERATION REPAIR Performed by: Manuela Schwartz Authorized by: Bethann Berkshire  L Location: Left index finger PIP joint.  Laceration length: 2.5 cm Anesthesia: digital block Local anesthetic: marcaine. Patient sedated: no  LACERATION REPAIR Performed by: Jakeim Sedore L Authorized by: Sumit Branham L Comments: Pt had a digital block with marcaine to left index finger.  4 ,  4-0 nylon sutures to close th 2 cm lac to left index finger   (including critical care time) DIAGNOSTIC STUDIES: Oxygen Saturation is 99% on room air, normal by my interpretation.    COORDINATION OF CARE: At 1235 PM Discussed treatment plan with patient which includes pain medicine, right index finger X-ray, and laceration repair. Patient agrees.   Labs Reviewed - No data to display Dg Finger Index Left  11/03/2011  *RADIOLOGY REPORT*  Clinical Data: Laceration to the index finger.  LEFT INDEX FINGER 2+V  Comparison: No priors.  Findings: There is a soft tissue irregularity in the mid left finger, corresponding to the reported laceration.  There is a minimally comminuted nondisplaced fracture of the middle phalanx of the left second finger, with a suggestion that the fracture may extend to the articular surface at the PIP joint.  No additional fractures are appreciated.  No dislocation or subluxation.  IMPRESSION: 1.  Mildly comminuted open fracture of the left second middle phalanx with possible intra-articular extension to the PIP joint, as above.  Original Report Authenticated By: Florencia Reasons, M.D.     No diagnosis found.    MDM  The chart was scribed for me under my direct supervision.  I personally performed the history, physical, and medical decision making and all procedures in the evaluation of this patient.Albert Lennert, MD 11/03/11 [phone]

## 2011-11-03 NOTE — ED Notes (Signed)
Lac to LIF this am. Cut on hedge clippers

## 2012-06-10 ENCOUNTER — Encounter (HOSPITAL_COMMUNITY): Payer: Self-pay

## 2012-06-10 ENCOUNTER — Emergency Department (HOSPITAL_COMMUNITY)
Admission: EM | Admit: 2012-06-10 | Discharge: 2012-06-10 | Disposition: A | Discharge status: Home / Self Care | Payer: Medicare Other | Attending: Emergency Medicine | Admitting: Emergency Medicine

## 2012-06-10 DIAGNOSIS — H53149 Visual discomfort, unspecified: Secondary | ICD-10-CM | POA: Insufficient documentation

## 2012-06-10 DIAGNOSIS — H5789 Other specified disorders of eye and adnexa: Secondary | ICD-10-CM | POA: Insufficient documentation

## 2012-06-10 DIAGNOSIS — Z79899 Other long term (current) drug therapy: Secondary | ICD-10-CM | POA: Insufficient documentation

## 2012-06-10 DIAGNOSIS — I10 Essential (primary) hypertension: Secondary | ICD-10-CM | POA: Insufficient documentation

## 2012-06-10 DIAGNOSIS — Z794 Long term (current) use of insulin: Secondary | ICD-10-CM | POA: Insufficient documentation

## 2012-06-10 DIAGNOSIS — H109 Unspecified conjunctivitis: Secondary | ICD-10-CM | POA: Insufficient documentation

## 2012-06-10 DIAGNOSIS — E119 Type 2 diabetes mellitus without complications: Secondary | ICD-10-CM | POA: Insufficient documentation

## 2012-06-10 DIAGNOSIS — Z7982 Long term (current) use of aspirin: Secondary | ICD-10-CM | POA: Insufficient documentation

## 2012-06-10 MED ORDER — ERYTHROMYCIN 5 MG/GM OP OINT
TOPICAL_OINTMENT | Freq: Once | OPHTHALMIC | Status: AC
  Administered 2012-06-10: 22:00:00 via OPHTHALMIC
  Filled 2012-06-10: qty 3.5

## 2012-06-10 MED ORDER — TETRACAINE HCL 0.5 % OP SOLN
1.0000 [drp] | Freq: Once | OPHTHALMIC | Status: AC
  Administered 2012-06-10: 1 [drp] via OPHTHALMIC
  Filled 2012-06-10: qty 2

## 2012-06-10 NOTE — ED Notes (Signed)
Raynelle Fanning idol PA in room doing eye exam at this time.

## 2012-06-10 NOTE — ED Notes (Signed)
Unable to do visual acuity due to pt not knowing the alphabet. Pt was able to identify the red and green on the chart with both eyes.

## 2012-06-10 NOTE — ED Notes (Signed)
Slit lamp and tetracaine at bedside at this time.

## 2012-06-10 NOTE — ED Notes (Signed)
Pt says thinks has something in his left eye x 1 week.  Eye is red and watery.   Pt says can see out of eye but is light sensitive.

## 2012-06-10 NOTE — ED Notes (Signed)
[name] idol PA in room with tonopen

## 2012-06-12 NOTE — ED Provider Notes (Signed)
History     CSN: [id number]  Arrival date & time 06/10/12  1727   First MD Initiated Contact with Patient 06/10/12 1948      Chief Complaint  Patient presents with  . Eye Pain    (Consider location/radiation/quality/duration/timing/severity/associated sxs/prior treatment) Patient is a 67 y.o. male presenting with eye pain. The history is provided by the patient.  Eye Pain This is a new problem. The current episode started in the past 7 days. The problem occurs constantly. The problem has been unchanged. Pertinent negatives include no abdominal pain, arthralgias, chest pain, congestion, fever, headaches, joint swelling, nausea, neck pain, numbness, rash, sore throat, visual change, vomiting or weakness. Associated symptoms comments: He reports feeling of a foreign body in the eye, intermittently, not constantly but does not recall specifically getting something in the eye. He states his left eye has been red and watering clear tears for the past week and has noticed pain seems worse when in sunlight (but not worse when in a well lit room).  The symptoms started gradually.  He denies any change in his vision.  He is supposed to wear glasses but hasn't in recent years.. He has tried nothing for the symptoms.    Past Medical History  Diagnosis Date  . Diabetes mellitus   . Hypertension     Past Surgical History  Procedure Laterality Date  . Abdominal surgery      No family history on file.  History  Substance Use Topics  . Smoking status: Never Smoker   . Smokeless tobacco: Not on file  . Alcohol Use: No      Review of Systems  Constitutional: Negative for fever.  HENT: Negative for congestion, sore throat and neck pain.   Eyes: Positive for photophobia, pain and redness. Negative for discharge, itching and visual disturbance.  Respiratory: Negative for chest tightness and shortness of breath.   Cardiovascular: Negative for chest pain.  Gastrointestinal: Negative for  nausea, vomiting and abdominal pain.  Genitourinary: Negative.   Musculoskeletal: Negative for joint swelling and arthralgias.  Skin: Negative.  Negative for rash and wound.  Neurological: Negative for dizziness, weakness, light-headedness, numbness and headaches.  Psychiatric/Behavioral: Negative.     Allergies  Review of patient's allergies indicates no known allergies.  Home Medications   Current Outpatient Rx  Name  Route  Sig  Dispense  Refill  . albuterol (PROVENTIL HFA;VENTOLIN HFA) 108 (90 BASE) MCG/ACT inhaler   Inhalation   Inhale 2 puffs into the lungs every 6 (six) hours as needed. Shortness of breath         . amLODipine (NORVASC) 10 MG tablet   Oral   Take 10 mg by mouth daily.         Marland Kitchen aspirin EC 81 MG tablet   Oral   Take 81 mg by mouth daily.         Marland Kitchen atorvastatin (LIPITOR) 80 MG tablet   Oral   Take 80 mg by mouth daily.         . enalapril (VASOTEC) 20 MG tablet   Oral   Take 20 mg by mouth daily.         . furosemide (LASIX) 40 MG tablet   Oral   Take 40 mg by mouth daily.         Marland Kitchen glipiZIDE (GLUCOTROL) 10 MG tablet   Oral   Take 10 mg by mouth daily.         Marland Kitchen guaiFENesin-codeine (ROBITUSSIN AC)  100-10 MG/5ML syrup      10 ml po q 4-6 hrs prn cough   240 mL   0   . insulin glargine (LANTUS SOLOSTAR) 100 UNIT/ML injection   Subcutaneous   Inject 10-16 Units into the skin at bedtime. Or as directed by MD         . omeprazole (PRILOSEC) 20 MG capsule   Oral   Take 20 mg by mouth daily.         . potassium chloride (K-DUR,KLOR-CON) 10 MEQ tablet   Oral   Take 10 mEq by mouth daily.           BP 154/97  Pulse 86  Temp(Src) 98.3 F (36.8 C) (Oral)  Resp 20  Wt 242 lb (109.77 kg)  BMI 32.81 kg/m2  SpO2 100%  Physical Exam  Nursing note and vitals reviewed. Constitutional: He is oriented to person, place, and time. He appears well-developed and well-nourished.  HENT:  Head: Normocephalic and atraumatic.   Right Ear: Tympanic membrane and ear canal normal.  Left Ear: Tympanic membrane and ear canal normal.  Nose: Mucosal edema and rhinorrhea present.  Mouth/Throat: Uvula is midline, oropharynx is clear and moist and mucous membranes are normal. No oropharyngeal exudate, posterior oropharyngeal edema, posterior oropharyngeal erythema or tonsillar abscesses.  Eyes: EOM and lids are normal. Pupils are equal, round, and reactive to light. No foreign bodies found. Left eye exhibits no exudate. No foreign body present in the left eye. Left conjunctiva is injected.  Fundoscopic exam:      The right eye shows red reflex.       The left eye shows no exudate. The left eye shows red reflex.  Slit lamp exam:      The left eye shows no corneal abrasion, no corneal flare, no corneal ulcer, no foreign body, no hyphema, no fluorescein uptake and no anterior chamber bulge.  Clear tear drainage from left eye.  Acuity ou 20/30 os 20/50, od 20/30.  Anterior chamber pressure measured x 3 - 4, 12, 20,  Averaging 12.  No consensual pain in left eye with right eye exam.  Neck: Normal range of motion.  Cardiovascular: Normal rate, regular rhythm, normal heart sounds and intact distal pulses.   Pulmonary/Chest: Effort normal and breath sounds normal. No respiratory distress. He has no wheezes. He has no rales.  Abdominal: Soft. Bowel sounds are normal. There is no tenderness.  Musculoskeletal: Normal range of motion.  Neurological: He is alert and oriented to person, place, and time.  Skin: Skin is warm and dry. No rash noted.  Psychiatric: He has a normal mood and affect.    ED Course  Procedures (including critical care time)  Labs Reviewed - No data to display No results found.   1. Conjunctivitis       MDM  Pt was given tetracaine in left eye prior to exam with complete resolution of discomfort,  Indicating superficial irritation, and not deeper process like iritis,  Uveitus.  No fb or abrasion  identified on exam.    He was given erythromyin ointment for comfort.  Referral to Dr Lita Mains - pt agrees to call for appt with him in 2 days if sx persist.    The patient appears reasonably screened and/or stabilized for discharge and I doubt any other medical condition or other [facility] requiring further screening, evaluation, or treatment in the ED at this time prior to discharge. Pt discussed with Dr. Adriana Simas prior to dc home.  Burgess Amor, PA-C 06/12/12 1308

## 2012-06-13 NOTE — ED Provider Notes (Signed)
Medical screening examination/treatment/procedure(s) were conducted as a shared visit with non-physician practitioner(s) and myself.  I personally evaluated the patient during the encounter.  Doubt glaucoma.  rx atb ointment.  Referral to ophth  Donnetta Hutching, MD 06/13/12 1043

## 2013-04-02 ENCOUNTER — Encounter (HOSPITAL_COMMUNITY): Payer: Self-pay | Admitting: Emergency Medicine

## 2013-04-02 ENCOUNTER — Emergency Department (HOSPITAL_COMMUNITY)
Admission: EM | Admit: 2013-04-02 | Discharge: 2013-04-02 | Disposition: A | Discharge status: Home / Self Care | Payer: Medicare Other | Attending: Emergency Medicine | Admitting: Emergency Medicine

## 2013-04-02 ENCOUNTER — Emergency Department (HOSPITAL_COMMUNITY): Payer: Medicare Other

## 2013-04-02 DIAGNOSIS — K5900 Constipation, unspecified: Secondary | ICD-10-CM

## 2013-04-02 DIAGNOSIS — E119 Type 2 diabetes mellitus without complications: Secondary | ICD-10-CM | POA: Insufficient documentation

## 2013-04-02 DIAGNOSIS — I10 Essential (primary) hypertension: Secondary | ICD-10-CM | POA: Insufficient documentation

## 2013-04-02 DIAGNOSIS — Z7982 Long term (current) use of aspirin: Secondary | ICD-10-CM | POA: Insufficient documentation

## 2013-04-02 DIAGNOSIS — Z79899 Other long term (current) drug therapy: Secondary | ICD-10-CM | POA: Insufficient documentation

## 2013-04-02 DIAGNOSIS — Z794 Long term (current) use of insulin: Secondary | ICD-10-CM | POA: Insufficient documentation

## 2013-04-02 LAB — URINALYSIS, ROUTINE W REFLEX MICROSCOPIC
BILIRUBIN URINE: NEGATIVE
Hgb urine dipstick: NEGATIVE
Ketones, ur: NEGATIVE mg/dL
Leukocytes, UA: NEGATIVE
Nitrite: NEGATIVE
Protein, ur: NEGATIVE mg/dL
SPECIFIC GRAVITY, URINE: 1.025 (ref 1.005–1.030)
Urobilinogen, UA: 0.2 mg/dL (ref 0.0–1.0)
pH: 5 (ref 5.0–8.0)

## 2013-04-02 LAB — COMPREHENSIVE METABOLIC PANEL
ALT: 20 U/L (ref 0–53)
AST: 18 U/L (ref 0–37)
Albumin: 3.9 g/dL (ref 3.5–5.2)
Alkaline Phosphatase: 119 U/L — ABNORMAL HIGH (ref 39–117)
BUN: 15 mg/dL (ref 6–23)
CALCIUM: 9.6 mg/dL (ref 8.4–10.5)
CO2: 27 meq/L (ref 19–32)
CREATININE: 0.86 mg/dL (ref 0.50–1.35)
Chloride: 101 mEq/L (ref 96–112)
GFR calc Af Amer: >90 mL/min (ref >90)
GFR calc non Af Amer: 89 mL/min — ABNORMAL LOW (ref >90)
Glucose, Bld: 385 mg/dL — ABNORMAL HIGH (ref 70–99)
Potassium: 4.2 mEq/L (ref 3.7–5.3)
SODIUM: 141 meq/L (ref 137–147)
TOTAL PROTEIN: 7.1 g/dL (ref 6.0–8.3)
Total Bilirubin: 0.5 mg/dL (ref 0.3–1.2)

## 2013-04-02 LAB — CBC
HCT: 37.4 % — ABNORMAL LOW (ref 39.0–52.0)
Hemoglobin: 13 g/dL (ref 13.0–17.0)
MCH: 26.9 pg (ref 26.0–34.0)
MCHC: 34.8 g/dL (ref 30.0–36.0)
MCV: 77.3 fL — ABNORMAL LOW (ref 78.0–100.0)
PLATELETS: 181 10*3/uL (ref 150–400)
RBC: 4.84 MIL/uL (ref 4.22–5.81)
RDW: 14.5 % (ref 11.5–15.5)
WBC: 4.7 10*3/uL (ref 4.0–10.5)

## 2013-04-02 LAB — URINE MICROSCOPIC-ADD ON

## 2013-04-02 MED ORDER — TRAMADOL HCL 50 MG PO TABS: 50.0000 mg | ORAL_TABLET | Freq: Four times a day (QID) | ORAL | Status: DC | PRN

## 2013-04-02 NOTE — ED Notes (Signed)
Pt states RLQ pain x 2 weeks. States he vomited a few nights ago but not since. States he was told to come to the ED by Dr. Sudie Bailey.

## 2013-04-02 NOTE — ED Provider Notes (Signed)
CSN: [id number]     Arrival date & time 04/02/13  1341 History   First MD Initiated Contact with Patient 04/02/13 1604     Chief Complaint  Patient presents with  . Abdominal Pain   (Consider location/radiation/quality/duration/timing/severity/associated sxs/prior Treatment) Patient is a 68 y.o. male presenting with abdominal pain. The history is provided by the patient (the pt complains of abd pain).  Abdominal Pain Pain location:  Epigastric Pain quality: aching   Pain radiates to:  Does not radiate Pain severity:  Mild Onset quality:  Gradual Timing:  Intermittent Progression:  Waxing and waning Chronicity:  New Context: not awakening from sleep   Associated symptoms: no chest pain, no cough, no diarrhea, no fatigue and no hematuria     Past Medical History  Diagnosis Date  . Diabetes mellitus   . Hypertension    Past Surgical History  Procedure Laterality Date  . Abdominal surgery     No family history on file. History  Substance Use Topics  . Smoking status: Never Smoker   . Smokeless tobacco: Not on file  . Alcohol Use: No    Review of Systems  Constitutional: Negative for appetite change and fatigue.  HENT: Negative for congestion, ear discharge and sinus pressure.   Eyes: Negative for discharge.  Respiratory: Negative for cough.   Cardiovascular: Negative for chest pain.  Gastrointestinal: Positive for abdominal pain. Negative for diarrhea.  Genitourinary: Negative for frequency and hematuria.  Musculoskeletal: Negative for back pain.  Skin: Negative for rash.  Neurological: Negative for seizures and headaches.  Psychiatric/Behavioral: Negative for hallucinations.    Allergies  Review of patient's allergies indicates no known allergies.  Home Medications   Current Outpatient Rx  Name  Route  Sig  Dispense  Refill  . albuterol (PROVENTIL HFA;VENTOLIN HFA) 108 (90 BASE) MCG/ACT inhaler   Inhalation   Inhale 2 puffs into the lungs every 6 (six) hours  as needed. Shortness of breath         . amLODipine (NORVASC) 10 MG tablet   Oral   Take 10 mg by mouth daily.         Marland Kitchen aspirin EC 81 MG tablet   Oral   Take 81 mg by mouth daily.         Marland Kitchen atorvastatin (LIPITOR) 80 MG tablet   Oral   Take 80 mg by mouth daily.         . enalapril (VASOTEC) 20 MG tablet   Oral   Take 20 mg by mouth daily.         . furosemide (LASIX) 40 MG tablet   Oral   Take 40 mg by mouth daily.         Marland Kitchen glipiZIDE (GLUCOTROL) 10 MG tablet   Oral   Take 10 mg by mouth daily.         Marland Kitchen guaiFENesin-codeine (ROBITUSSIN AC) 100-10 MG/5ML syrup      10 ml po q 4-6 hrs prn cough   240 mL   0   . insulin glargine (LANTUS SOLOSTAR) 100 UNIT/ML injection   Subcutaneous   Inject 10-16 Units into the skin at bedtime. Or as directed by MD         . omeprazole (PRILOSEC) 20 MG capsule   Oral   Take 20 mg by mouth daily.         . potassium chloride (K-DUR,KLOR-CON) 10 MEQ tablet   Oral   Take 10 mEq by mouth daily.         Marland Kitchen  traMADol (ULTRAM) 50 MG tablet   Oral   Take 1 tablet (50 mg total) by mouth every 6 (six) hours as needed.   15 tablet   0    BP 126/75  Pulse 78  Temp(Src) 98.2 F (36.8 C) (Oral)  Resp 18  Wt 240 lb (108.863 kg)  SpO2 95% Physical Exam  Constitutional: He is oriented to person, place, and time. He appears well-developed.  HENT:  Head: Normocephalic.  Eyes: Conjunctivae and EOM are normal. No scleral icterus.  Neck: Neck supple. No thyromegaly present.  Cardiovascular: Normal rate and regular rhythm.  Exam reveals no gallop and no friction rub.   No murmur heard. Pulmonary/Chest: No stridor. He has no wheezes. He has no rales. He exhibits no tenderness.  Abdominal: He exhibits no distension. There is tenderness. There is no rebound.  Mild periumbilical abd pain  Musculoskeletal: Normal range of motion. He exhibits no edema.  Lymphadenopathy:    He has no cervical adenopathy.  Neurological: He is  oriented to person, place, and time. He exhibits normal muscle tone. Coordination normal.  Skin: No rash noted. No erythema.  Psychiatric: He has a normal mood and affect. His behavior is normal.    ED Course  Procedures (including critical care time) Labs Review Labs Reviewed  CBC - Abnormal; Notable for the following:    HCT 37.4 (*)    MCV 77.3 (*)    All other components within normal limits  COMPREHENSIVE METABOLIC PANEL - Abnormal; Notable for the following:    Glucose, Bld 385 (*)    Alkaline Phosphatase 119 (*)    GFR calc non Af Amer 89 (*)    All other components within normal limits  URINALYSIS, ROUTINE W REFLEX MICROSCOPIC - Abnormal; Notable for the following:    Glucose, UA >1000 (*)    All other components within normal limits  URINE MICROSCOPIC-ADD ON - Abnormal; Notable for the following:    Squamous Epithelial / LPF FEW (*)    All other components within normal limits   Imaging Review Dg Abd Acute W/chest  04/02/2013   CLINICAL DATA:  Right lower quadrant abdominal pain.  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 Abdominal films show a fairly large amount of stool throughout much of the colon. No focal fecal impaction is identified. There is no evidence of small bowel obstruction, significant ileus or free air. No abnormal calcifications are seen. Degenerative changes are present in the lower lumbar spine.  IMPRESSION: Moderate fecal material throughout the colon. No evidence of bowel obstruction or significant ileus.   Electronically Signed   By: Irish Lack M.D.   On: 04/02/2013 16:40    EKG Interpretation   None       MDM   1. Constipation       Benny Lennert, MD 04/02/13 1725

## 2013-04-02 NOTE — Discharge Instructions (Signed)
Drink plenty of fluids.  Follow up with your md later this week.

## 2013-07-31 ENCOUNTER — Other Ambulatory Visit (HOSPITAL_COMMUNITY): Payer: Self-pay | Admitting: Internal Medicine

## 2013-07-31 ENCOUNTER — Ambulatory Visit (HOSPITAL_COMMUNITY)
Admission: RE | Admit: 2013-07-31 | Discharge: 2013-07-31 | Disposition: A | Discharge status: Home / Self Care | Payer: Medicare Other | Source: Ambulatory Visit | Attending: Internal Medicine | Admitting: Internal Medicine

## 2013-07-31 DIAGNOSIS — M79609 Pain in unspecified limb: Secondary | ICD-10-CM | POA: Insufficient documentation

## 2013-07-31 DIAGNOSIS — M19039 Primary osteoarthritis, unspecified wrist: Secondary | ICD-10-CM | POA: Insufficient documentation

## 2013-07-31 DIAGNOSIS — M259 Joint disorder, unspecified: Secondary | ICD-10-CM | POA: Insufficient documentation

## 2013-10-02 ENCOUNTER — Emergency Department (HOSPITAL_COMMUNITY)
Admission: EM | Admit: 2013-10-02 | Discharge: 2013-10-02 | Disposition: A | Discharge status: Home / Self Care | Payer: Medicare Other | Attending: Emergency Medicine | Admitting: Emergency Medicine

## 2013-10-02 ENCOUNTER — Encounter (HOSPITAL_COMMUNITY): Payer: Self-pay | Admitting: Emergency Medicine

## 2013-10-02 DIAGNOSIS — R112 Nausea with vomiting, unspecified: Secondary | ICD-10-CM | POA: Diagnosis not present

## 2013-10-02 DIAGNOSIS — R109 Unspecified abdominal pain: Secondary | ICD-10-CM

## 2013-10-02 DIAGNOSIS — Z79899 Other long term (current) drug therapy: Secondary | ICD-10-CM | POA: Diagnosis not present

## 2013-10-02 DIAGNOSIS — Z794 Long term (current) use of insulin: Secondary | ICD-10-CM | POA: Diagnosis not present

## 2013-10-02 DIAGNOSIS — Z7982 Long term (current) use of aspirin: Secondary | ICD-10-CM | POA: Insufficient documentation

## 2013-10-02 DIAGNOSIS — I10 Essential (primary) hypertension: Secondary | ICD-10-CM | POA: Diagnosis not present

## 2013-10-02 DIAGNOSIS — R1084 Generalized abdominal pain: Secondary | ICD-10-CM | POA: Diagnosis not present

## 2013-10-02 DIAGNOSIS — E119 Type 2 diabetes mellitus without complications: Secondary | ICD-10-CM | POA: Insufficient documentation

## 2013-10-02 LAB — CBC WITH DIFFERENTIAL/PLATELET
BASOS PCT: 1 % (ref 0–1)
Basophils Absolute: 0 10*3/uL (ref 0.0–0.1)
EOS ABS: 0.3 10*3/uL (ref 0.0–0.7)
Eosinophils Relative: 6 % — ABNORMAL HIGH (ref 0–5)
HCT: 36.9 % — ABNORMAL LOW (ref 39.0–52.0)
Hemoglobin: 12.8 g/dL — ABNORMAL LOW (ref 13.0–17.0)
LYMPHS ABS: 1.6 10*3/uL (ref 0.7–4.0)
Lymphocytes Relative: 37 % (ref 12–46)
MCH: 26.6 pg (ref 26.0–34.0)
MCHC: 34.7 g/dL (ref 30.0–36.0)
MCV: 76.7 fL — ABNORMAL LOW (ref 78.0–100.0)
MONOS PCT: 8 % (ref 3–12)
Monocytes Absolute: 0.4 10*3/uL (ref 0.1–1.0)
NEUTROS ABS: 2.1 10*3/uL (ref 1.7–7.7)
NEUTROS PCT: 48 % (ref 43–77)
PLATELETS: 180 10*3/uL (ref 150–400)
RBC: 4.81 MIL/uL (ref 4.22–5.81)
RDW: 14.2 % (ref 11.5–15.5)
WBC: 4.3 10*3/uL (ref 4.0–10.5)

## 2013-10-02 LAB — COMPREHENSIVE METABOLIC PANEL
ALBUMIN: 3.6 g/dL (ref 3.5–5.2)
ALK PHOS: 103 U/L (ref 39–117)
ALT: 12 U/L (ref 0–53)
AST: 16 U/L (ref 0–37)
Anion gap: 6 (ref 5–15)
BUN: 12 mg/dL (ref 6–23)
CHLORIDE: 103 meq/L (ref 96–112)
CO2: 33 mEq/L — ABNORMAL HIGH (ref 19–32)
Calcium: 9.3 mg/dL (ref 8.4–10.5)
Creatinine, Ser: 0.9 mg/dL (ref 0.50–1.35)
GFR calc Af Amer: >90 mL/min (ref >90)
GFR calc non Af Amer: 87 mL/min — ABNORMAL LOW (ref >90)
GLUCOSE: 147 mg/dL — AB (ref 70–99)
POTASSIUM: 4.4 meq/L (ref 3.7–5.3)
SODIUM: 142 meq/L (ref 137–147)
Total Bilirubin: 0.6 mg/dL (ref 0.3–1.2)
Total Protein: 6.5 g/dL (ref 6.0–8.3)

## 2013-10-02 LAB — LIPASE, BLOOD: Lipase: 28 U/L (ref 11–59)

## 2013-10-02 LAB — TROPONIN I: Troponin I: <0.3 ng/mL (ref <0.30)

## 2013-10-02 MED ORDER — DOCUSATE SODIUM 100 MG PO CAPS: 100.0000 mg | ORAL_CAPSULE | Freq: Two times a day (BID) | ORAL | Status: DC

## 2013-10-02 MED ORDER — ONDANSETRON HCL 4 MG PO TABS: 4.0000 mg | ORAL_TABLET | Freq: Four times a day (QID) | ORAL | Status: DC

## 2013-10-02 MED ORDER — KETOROLAC TROMETHAMINE 30 MG/ML IJ SOLN
15.0000 mg | Freq: Once | INTRAMUSCULAR | Status: AC
  Administered 2013-10-02: 15 mg via INTRAVENOUS
  Filled 2013-10-02: qty 1

## 2013-10-02 MED ORDER — ONDANSETRON HCL 4 MG/2ML IJ SOLN
4.0000 mg | Freq: Once | INTRAMUSCULAR | Status: AC
  Administered 2013-10-02: 4 mg via INTRAVENOUS
  Filled 2013-10-02: qty 2

## 2013-10-02 NOTE — Discharge Instructions (Signed)
Abdominal Pain °Many things can cause abdominal pain. Usually, abdominal pain is not caused by a disease and will improve without treatment. It can often be observed and treated at home. Your health care provider will do a physical exam and possibly order blood tests and X-rays to help determine the seriousness of your pain. However, in many cases, more time must pass before a clear cause of the pain can be found. Before that point, your health care provider may not know if you need more testing or further treatment. °HOME CARE INSTRUCTIONS  °Monitor your abdominal pain for any changes. The following actions may help to alleviate any discomfort you are experiencing: °· Only take over-the-counter or prescription medicines as directed by your health care provider. °· Do not take laxatives unless directed to do so by your health care provider. °· Try a clear liquid diet (broth, tea, or water) as directed by your health care provider. Slowly move to a bland diet as tolerated. °SEEK MEDICAL CARE IF: °· You have unexplained abdominal pain. °· You have abdominal pain associated with nausea or diarrhea. °· You have pain when you urinate or have a bowel movement. °· You experience abdominal pain that wakes you in the night. °· You have abdominal pain that is worsened or improved by eating food. °· You have abdominal pain that is worsened with eating fatty foods. °· You have a fever. °SEEK IMMEDIATE MEDICAL CARE IF:  °· Your pain does not go away within 2 hours. °· You keep throwing up (vomiting). °· Your pain is felt only in portions of the abdomen, such as the right side or the left lower portion of the abdomen. °· You pass bloody or black tarry stools. °MAKE SURE YOU: °· Understand these instructions.   °· Will watch your condition.   °· Will get help right away if you are not doing well or get worse.   °Document Released: 12/16/2004 Document Revised: 03/13/2013 Document Reviewed: 11/15/2012 °[employer]® Patient Information  ©2015 [employer]. This information is not intended to replace advice given to you by your health care provider. Make sure you discuss any questions you have with your health care provider. ° °Nausea and Vomiting °Nausea is a sick feeling that often comes before throwing up (vomiting). Vomiting is a reflex where stomach contents come out of your mouth. Vomiting can cause severe loss of body fluids (dehydration). Children and elderly adults can become dehydrated quickly, especially if they also have diarrhea. Nausea and vomiting are symptoms of a condition or disease. It is important to find the cause of your symptoms. °CAUSES  °· Direct irritation of the stomach lining. This irritation can result from increased acid production (gastroesophageal reflux disease), infection, food poisoning, taking certain medicines (such as nonsteroidal anti-inflammatory drugs), alcohol use, or tobacco use. °· Signals from the brain. These signals could be caused by a headache, heat exposure, an inner ear disturbance, increased pressure in the brain from injury, infection, a tumor, or a concussion, pain, emotional stimulus, or metabolic problems. °· An obstruction in the gastrointestinal tract (bowel obstruction). °· Illnesses such as diabetes, hepatitis, gallbladder problems, appendicitis, kidney problems, cancer, sepsis, atypical symptoms of a heart attack, or eating disorders. °· Medical treatments such as chemotherapy and radiation. °· Receiving medicine that makes you sleep (general anesthetic) during surgery. °DIAGNOSIS °Your caregiver may ask for tests to be done if the problems do not improve after a few days. Tests may also be done if symptoms are severe or if the reason for the nausea   and vomiting is not clear. Tests may include: °· Urine tests. °· Blood tests. °· Stool tests. °· Cultures (to look for evidence of infection). °· X-rays or other imaging studies. °Test results can help your caregiver make decisions about  treatment or the need for additional tests. °TREATMENT °You need to stay well hydrated. Drink frequently but in small amounts. You may wish to drink water, sports drinks, clear broth, or eat frozen ice pops or gelatin dessert to help stay hydrated. When you eat, eating slowly may help prevent nausea. There are also some antinausea medicines that may help prevent nausea. °HOME CARE INSTRUCTIONS  °· Take all medicine as directed by your caregiver. °· If you do not have an appetite, do not force yourself to eat. However, you must continue to drink fluids. °· If you have an appetite, eat a normal diet unless your caregiver tells you differently. °¨ Eat a variety of complex carbohydrates (rice, wheat, potatoes, bread), lean meats, yogurt, fruits, and vegetables. °¨ Avoid high-fat foods because they are more difficult to digest. °· Drink enough water and fluids to keep your urine clear or pale yellow. °· If you are dehydrated, ask your caregiver for specific rehydration instructions. Signs of dehydration may include: °¨ Severe thirst. °¨ Dry lips and mouth. °¨ Dizziness. °¨ Dark urine. °¨ Decreasing urine frequency and amount. °¨ Confusion. °¨ Rapid breathing or pulse. °SEEK IMMEDIATE MEDICAL CARE IF:  °· You have blood or brown flecks (like coffee grounds) in your vomit. °· You have black or bloody stools. °· You have a severe headache or stiff neck. °· You are confused. °· You have severe abdominal pain. °· You have chest pain or trouble breathing. °· You do not urinate at least once every 8 hours. °· You develop cold or clammy skin. °· You continue to vomit for longer than 24 to 48 hours. °· You have a fever. °MAKE SURE YOU:  °· Understand these instructions. °· Will watch your condition. °· Will get help right away if you are not doing well or get worse. °Document Released: 03/08/2005 Document Revised: 05/31/2011 Document Reviewed: 08/05/2010 °[employer]® Patient Information ©2015 [employer]. This information is not  intended to replace advice given to you by your health care provider. Make sure you discuss any questions you have with your health care provider. ° °

## 2013-10-02 NOTE — ED Notes (Signed)
Abdomen pain for one week. Rating pain 6.  Denies n/v/d.

## 2013-10-02 NOTE — ED Notes (Signed)
Pt c/o generalized abd pain that started a week ago, admits to n/v X1, denies any diarrhea.

## 2013-10-02 NOTE — ED Provider Notes (Signed)
TIME SEEN: 1:20 PM  CHIEF COMPLAINT: Abdominal pain, nausea and vomiting  HPI: Patient is a 68 year old male with a history of hypertension, diabetes who presents emergency department with a week of generalized abdominal pain that he is unable to describe and one episode of vomiting that he states happened last week. Denies any radiation of pain. No aggravating or alleviating factors. Denies any fever. No chest pain or shortness of breath. History is very limited as patient appears to have some cognitive delay. Denies any sick contacts or recent travel. No dysuria or hematuria. No fevers. No diarrhea. No bloody stool or melena.  ROS: See HPI Constitutional: no fever  Eyes: no drainage  ENT: no runny nose   Cardiovascular:  no chest pain  Resp: no SOB  GI:  vomiting GU: no dysuria Integumentary: no rash  Allergy: no hives  Musculoskeletal: no leg swelling  Neurological: no slurred speech ROS otherwise negative  PAST MEDICAL HISTORY/PAST SURGICAL HISTORY:  Past Medical History  Diagnosis Date  . Diabetes mellitus   . Hypertension     MEDICATIONS:  Prior to Admission medications   Medication Sig Start Date End Date Taking? Authorizing Provider  albuterol (PROVENTIL HFA;VENTOLIN HFA) 108 (90 BASE) MCG/ACT inhaler Inhale 2 puffs into the lungs every 6 (six) hours as needed. Shortness of breath    Historical Provider, MD  amLODipine (NORVASC) 10 MG tablet Take 10 mg by mouth daily.    Historical Provider, MD  aspirin EC 81 MG tablet Take 81 mg by mouth daily.    Historical Provider, MD  atorvastatin (LIPITOR) 80 MG tablet Take 80 mg by mouth daily.    Historical Provider, MD  enalapril (VASOTEC) 20 MG tablet Take 20 mg by mouth daily.    Historical Provider, MD  furosemide (LASIX) 40 MG tablet Take 40 mg by mouth daily.    Historical Provider, MD  glipiZIDE (GLUCOTROL) 10 MG tablet Take 10 mg by mouth daily.    Historical Provider, MD  guaiFENesin-codeine [facility]) 100-10 MG/5ML  syrup 10 ml po q 4-6 hrs prn cough 04/03/11   Richard Hyacinth Meeker, PA-C  insulin glargine (LANTUS SOLOSTAR) 100 UNIT/ML injection Inject 10-16 Units into the skin at bedtime. Or as directed by MD    Historical Provider, MD  omeprazole (PRILOSEC) 20 MG capsule Take 20 mg by mouth daily.    Historical Provider, MD  potassium chloride (K-DUR,KLOR-CON) 10 MEQ tablet Take 10 mEq by mouth daily.    Historical Provider, MD  traMADol (ULTRAM) 50 MG tablet Take 1 tablet (50 mg total) by mouth every 6 (six) hours as needed. 04/02/13   Benny Lennert, MD    ALLERGIES:  No Known Allergies  SOCIAL HISTORY:  History  Substance Use Topics  . Smoking status: Never Smoker   . Smokeless tobacco: Not on file  . Alcohol Use: No    FAMILY HISTORY: No family history on file.  EXAM: BP 153/86  Pulse 70  Temp(Src) 98.4 F (36.9 C) (Temporal)  Resp 16  Wt 240 lb (108.863 kg)  SpO2 94% CONSTITUTIONAL: Alert and oriented and responds appropriately to questions. Well-appearing; well-nourished, patient appears to have a mild cognitive delay HEAD: Normocephalic EYES: Conjunctivae clear, PERRL ENT: normal nose; no rhinorrhea; moist mucous membranes; pharynx without lesions noted NECK: Supple, no meningismus, no LAD  CARD: RRR; S1 and S2 appreciated; no murmurs, no clicks, no rubs, no gallops RESP: Normal chest excursion without splinting or tachypnea; breath sounds clear and equal bilaterally; no wheezes, no  rhonchi, no rales,  ABD/GI: Normal bowel sounds; non-distended; soft, non-tender, no rebound, no guarding, no peritoneal signs BACK:  The back appears normal and is non-tender to palpation, there is no CVA tenderness EXT: Normal ROM in all joints; non-tender to palpation; no edema; normal capillary refill; no cyanosis    SKIN: Normal color for age and race; warm NEURO: Moves all extremities equally PSYCH: The patient's mood and manner are appropriate. Grooming and personal hygiene are appropriate.  MEDICAL  DECISION MAKING: Patient here with generalized abdominal pain with one episode of nonbloody, nonbilious vomiting. Will obtain abdominal labs and urine. Will give Zofran Toradol and reassess. At this time do not feel he needs CT imaging as he is very well-appearing, completely benign abdominal exam.  ED PROGRESS: Patient's lab work is all unremarkable other than a slightly elevated bicarbonate which he has had in the past. Troponin negative. He states he is feeling somewhat better after Zofran and Toradol. His abdominal exam is still completely benign. I feel he is safe to be discharged home. I am not concerned for any life-threatening illness at this time. Discussed with his wife Corrie Dandy and [phone] who agrees with plan. Discussed strict return precautions with patient and wife.    EKG Interpretation  Date/Time:  Tuesday October 02 2013 13:45:36 EDT Ventricular Rate:  66 PR Interval:  203 QRS Duration: 98 QT Interval:  405 QTC Calculation: 424 R Axis:   68 Text Interpretation:  Sinus rhythm Probable left atrial enlargement Anteroseptal infarct, old No significant change since last tracing Confirmed by Rozlynn Lippold,  DO, Terea Neubauer [id number]) on 10/02/2013 2:51:23 PM        Layla Maw Bently Morath, DO 10/02/13 1459

## 2013-11-06 ENCOUNTER — Emergency Department (HOSPITAL_COMMUNITY)
Admission: EM | Admit: 2013-11-06 | Discharge: 2013-11-06 | Disposition: A | Discharge status: Home / Self Care | Payer: Medicare Other | Attending: Emergency Medicine | Admitting: Emergency Medicine

## 2013-11-06 ENCOUNTER — Encounter (HOSPITAL_COMMUNITY): Payer: Self-pay | Admitting: Emergency Medicine

## 2013-11-06 DIAGNOSIS — E119 Type 2 diabetes mellitus without complications: Secondary | ICD-10-CM | POA: Diagnosis not present

## 2013-11-06 DIAGNOSIS — Z7982 Long term (current) use of aspirin: Secondary | ICD-10-CM | POA: Diagnosis not present

## 2013-11-06 DIAGNOSIS — Z79899 Other long term (current) drug therapy: Secondary | ICD-10-CM | POA: Insufficient documentation

## 2013-11-06 DIAGNOSIS — Y929 Unspecified place or not applicable: Secondary | ICD-10-CM | POA: Insufficient documentation

## 2013-11-06 DIAGNOSIS — Y939 Activity, unspecified: Secondary | ICD-10-CM | POA: Insufficient documentation

## 2013-11-06 DIAGNOSIS — R51 Headache: Secondary | ICD-10-CM | POA: Insufficient documentation

## 2013-11-06 DIAGNOSIS — T148XXA Other injury of unspecified body region, initial encounter: Secondary | ICD-10-CM | POA: Diagnosis not present

## 2013-11-06 DIAGNOSIS — I10 Essential (primary) hypertension: Secondary | ICD-10-CM | POA: Insufficient documentation

## 2013-11-06 DIAGNOSIS — Z794 Long term (current) use of insulin: Secondary | ICD-10-CM | POA: Insufficient documentation

## 2013-11-06 DIAGNOSIS — X58XXXA Exposure to other specified factors, initial encounter: Secondary | ICD-10-CM | POA: Diagnosis not present

## 2013-11-06 NOTE — ED Provider Notes (Signed)
CSN: [id number]     Arrival date & time 11/06/13  1707 History   First MD Initiated Contact with Patient 11/06/13 1910     Chief Complaint  Patient presents with  . Headache     (Consider location/radiation/quality/duration/timing/severity/associated sxs/prior Treatment) HPI   [name] is a 68 y.o. male who complains of a soreness of his posterior scalp, and left posterior neck. Symptoms have been present for several days. The symptoms are aggravating, but not painful. He denies fever, chills, nausea, vomiting, chest pain, shortness of breath, weakness, or dizziness. He's taking his usual medications, without relief.   Past Medical History  Diagnosis Date  . Diabetes mellitus   . Hypertension    Past Surgical History  Procedure Laterality Date  . Abdominal surgery     History reviewed. No pertinent family history. History  Substance Use Topics  . Smoking status: Never Smoker   . Smokeless tobacco: Not on file  . Alcohol Use: No    Review of Systems  All other systems reviewed and are negative.     Allergies  Review of patient's allergies indicates no known allergies.  Home Medications   Prior to Admission medications   Medication Sig Start Date End Date Taking? Authorizing Provider  albuterol (PROVENTIL HFA;VENTOLIN HFA) 108 (90 BASE) MCG/ACT inhaler Inhale 2 puffs into the lungs every 6 (six) hours as needed. Shortness of breath    Historical Provider, MD  amLODipine (NORVASC) 10 MG tablet Take 10 mg by mouth daily.    Historical Provider, MD  aspirin EC 81 MG tablet Take 81 mg by mouth daily.    Historical Provider, MD  atorvastatin (LIPITOR) 80 MG tablet Take 80 mg by mouth daily.    Historical Provider, MD  docusate sodium (COLACE) 100 MG capsule Take 1 capsule (100 mg total) by mouth every 12 (twelve) hours. 10/02/13   Kristen N Ward, DO  enalapril (VASOTEC) 20 MG tablet Take 20 mg by mouth daily.    Historical Provider, MD  furosemide (LASIX) 40 MG tablet  Take 40 mg by mouth daily as needed for fluid.     Historical Provider, MD  glipiZIDE (GLUCOTROL) 10 MG tablet Take 10 mg by mouth daily.    Historical Provider, MD  guaiFENesin-codeine [facility]) 100-10 MG/5ML syrup 10 ml po q 4-6 hrs prn cough 04/03/11   Richard Hyacinth Meeker, PA-C  insulin glargine (LANTUS SOLOSTAR) 100 UNIT/ML injection Inject 10-16 Units into the skin at bedtime. Or as directed by MD    Historical Provider, MD  metFORMIN (GLUCOPHAGE) 1000 MG tablet Take 1,000 mg by mouth 2 (two) times daily.    Historical Provider, MD  omeprazole (PRILOSEC) 20 MG capsule Take 20 mg by mouth daily.    Historical Provider, MD  ondansetron (ZOFRAN) 4 MG tablet Take 1 tablet (4 mg total) by mouth every 6 (six) hours. 10/02/13   Kristen N Ward, DO  potassium chloride (K-DUR,KLOR-CON) 10 MEQ tablet Take 10 mEq by mouth daily.    Historical Provider, MD  potassium chloride (MICRO-K) 10 MEQ CR capsule Take 10 mEq by mouth daily.    Historical Provider, MD  traMADol (ULTRAM) 50 MG tablet Take 1 tablet (50 mg total) by mouth every 6 (six) hours as needed. 04/02/13   Benny Lennert, MD   BP 139/80  Pulse 78  Temp(Src) 98.9 F (37.2 C) (Oral)  Resp 20  Ht 5\' 7"  (1.702 m)  Wt 198 lb 14.4 oz (90.22 kg)  BMI 31.14 kg/m2  SpO2 99% Physical Exam  Nursing note and vitals reviewed. Constitutional: He is oriented to person, place, and time. He appears well-developed and well-nourished.  HENT:  Head: Normocephalic and atraumatic.  Right Ear: External ear normal.  Left Ear: External ear normal.  Eyes: Conjunctivae and EOM are normal. Pupils are equal, round, and reactive to light.  Neck: Normal range of motion and phonation normal. Neck supple.  Cardiovascular: Normal rate, regular rhythm, normal heart sounds and intact distal pulses.   Pulmonary/Chest: Effort normal and breath sounds normal. He exhibits no bony tenderness.  Abdominal: Soft. There is no tenderness.  Musculoskeletal: Normal range of  motion.  Mild tenderness of the left trapezius muscle.  Neurological: He is alert and oriented to person, place, and time. No cranial nerve deficit or sensory deficit. He exhibits normal muscle tone. Coordination normal.  Skin: Skin is warm, dry and intact.  There are no scalp lesions, rash or adenopathy, associated with the scalp  Psychiatric: He has a normal mood and affect. His behavior is normal. Judgment and thought content normal.    ED Course  Procedures (including critical care time)  Findings discussed with the patient, and his family member, who is with him. All questions were answered.  MDM   Final diagnoses:  Muscle strain    Nonspecific discomfort of the scalp and left trapezius. This most likely represents muscle spasm or strain.  Nursing Notes Reviewed/ Care Coordinated Applicable Imaging Reviewed Interpretation of Laboratory Data incorporated into ED treatment  The patient appears reasonably screened and/or stabilized for discharge and I doubt any other medical condition or other [place] requiring further screening, evaluation, or treatment in the ED at this time prior to discharge.  Plan: Home Medications- usual; Home Treatments- warm compress; return here if the recommended treatment, does not improve the symptoms; Recommended follow up- PCP prn    Flint Melter, MD 11/06/13 Ernestina [place]
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
## 2013-11-06 NOTE — Discharge Instructions (Signed)
Use a warm, moist compress on the sore area of your scalp, and neck, 3 or 4 times a day, for 20 minutes. Use Tylenol every 4 hours as needed for pain.   Muscle Strain A muscle strain is an injury that occurs when a muscle is stretched beyond its normal length. Usually a small number of muscle fibers are torn when this happens. Muscle strain is rated in degrees. First-degree strains have the least amount of muscle fiber tearing and pain. Second-degree and third-degree strains have increasingly more tearing and pain.  Usually, recovery from muscle strain takes 1-2 weeks. Complete healing takes 5-6 weeks.  CAUSES  Muscle strain happens when a sudden, violent force placed on a muscle stretches it too far. This may occur with lifting, sports, or a fall.  RISK FACTORS Muscle strain is especially common in athletes.  SIGNS AND SYMPTOMS At the site of the muscle strain, there may be:  Pain.  Bruising.  Swelling.  Difficulty using the muscle due to pain or lack of normal function. DIAGNOSIS  Your health care provider will perform a physical exam and ask about your medical history. TREATMENT  Often, the best treatment for a muscle strain is resting, icing, and applying cold compresses to the injured area.  HOME CARE INSTRUCTIONS   Use the PRICE method of treatment to promote muscle healing during the first 2-3 days after your injury. The PRICE method involves:  Protecting the muscle from being injured again.  Restricting your activity and resting the injured body part.  Icing your injury. To do this, put ice in a plastic bag. Place a towel between your skin and the bag. Then, apply the ice and leave it on from 15-20 minutes each hour. After the third day, switch to moist heat packs.  Apply compression to the injured area with a splint or elastic bandage. Be careful not to wrap it too tightly. This may interfere with blood circulation or increase swelling.  Elevate the injured body part  above the level of your heart as often as you can.  Only take over-the-counter or prescription medicines for pain, discomfort, or fever as directed by your health care provider.  Warming up prior to exercise helps to prevent future muscle strains. SEEK MEDICAL CARE IF:   You have increasing pain or swelling in the injured area.  You have numbness, tingling, or a significant loss of strength in the injured area. MAKE SURE YOU:   Understand these instructions.  Will watch your condition.  Will get help right away if you are not doing well or get worse. Document Released: 03/08/2005 Document Revised: 12/27/2012 Document Reviewed: 10/05/2012 [facility] Patient Information 2015 [place], [place]. This information is not intended to replace advice given to you by your health care provider. Make sure you discuss any questions you have with your health care provider.

## 2013-11-26 ENCOUNTER — Emergency Department (HOSPITAL_COMMUNITY)
Admission: EM | Admit: 2013-11-26 | Discharge: 2013-11-26 | Disposition: A | Discharge status: Home / Self Care | Payer: Medicare Other | Attending: Emergency Medicine | Admitting: Emergency Medicine

## 2013-11-26 ENCOUNTER — Emergency Department (HOSPITAL_COMMUNITY): Payer: Medicare Other

## 2013-11-26 ENCOUNTER — Encounter (HOSPITAL_COMMUNITY): Payer: Self-pay | Admitting: Emergency Medicine

## 2013-11-26 DIAGNOSIS — R05 Cough: Secondary | ICD-10-CM | POA: Diagnosis present

## 2013-11-26 DIAGNOSIS — R059 Cough, unspecified: Secondary | ICD-10-CM | POA: Diagnosis present

## 2013-11-26 DIAGNOSIS — I10 Essential (primary) hypertension: Secondary | ICD-10-CM | POA: Insufficient documentation

## 2013-11-26 DIAGNOSIS — Z79899 Other long term (current) drug therapy: Secondary | ICD-10-CM | POA: Insufficient documentation

## 2013-11-26 DIAGNOSIS — Z794 Long term (current) use of insulin: Secondary | ICD-10-CM | POA: Insufficient documentation

## 2013-11-26 DIAGNOSIS — Z7982 Long term (current) use of aspirin: Secondary | ICD-10-CM | POA: Diagnosis not present

## 2013-11-26 DIAGNOSIS — E119 Type 2 diabetes mellitus without complications: Secondary | ICD-10-CM | POA: Insufficient documentation

## 2013-11-26 DIAGNOSIS — J209 Acute bronchitis, unspecified: Secondary | ICD-10-CM | POA: Diagnosis not present

## 2013-11-26 DIAGNOSIS — J9801 Acute bronchospasm: Secondary | ICD-10-CM

## 2013-11-26 DIAGNOSIS — J40 Bronchitis, not specified as acute or chronic: Secondary | ICD-10-CM

## 2013-11-26 MED ORDER — IPRATROPIUM-ALBUTEROL 0.5-2.5 (3) MG/3ML IN SOLN
3.0000 mL | Freq: Once | RESPIRATORY_TRACT | Status: AC
  Administered 2013-11-26: 3 mL via RESPIRATORY_TRACT
  Filled 2013-11-26: qty 3

## 2013-11-26 MED ORDER — ALBUTEROL SULFATE HFA 108 (90 BASE) MCG/ACT IN AERS: 1.0000 | INHALATION_SPRAY | Freq: Four times a day (QID) | RESPIRATORY_TRACT | Status: DC | PRN

## 2013-11-26 MED ORDER — AMOXICILLIN 500 MG PO CAPS: 500.0000 mg | ORAL_CAPSULE | Freq: Three times a day (TID) | ORAL | Status: DC

## 2013-11-26 MED ORDER — ALBUTEROL SULFATE (2.5 MG/3ML) 0.083% IN NEBU
2.5000 mg | INHALATION_SOLUTION | Freq: Once | RESPIRATORY_TRACT | Status: AC
  Administered 2013-11-26: 2.5 mg via RESPIRATORY_TRACT
  Filled 2013-11-26: qty 3

## 2013-11-26 NOTE — ED Notes (Signed)
Pt presents to the ED with productive cough. Pt states he has been feeling bad q 1 week. Pt denies any body aches, weakness, n/v/d.

## 2013-11-26 NOTE — ED Provider Notes (Signed)
CSN: [id number]     Arrival date & time 11/26/13  1132 History  This chart was scribed for Albert Lennert, MD by Milly Jakob, ED Scribe. The patient was seen in room APA19/APA19. Patient's care was started at 1:14 PM.   Chief Complaint  Patient presents with  . URI   Patient is a 68 y.o. male presenting with URI. The history is provided by the patient. No language interpreter was used.  URI Presenting symptoms: cough   Presenting symptoms: no congestion and no fatigue   Cough:    Cough characteristics:  Productive   Sputum characteristics:  White   Severity:  Moderate   Onset quality:  Gradual   Duration:  2 weeks   Timing:  Intermittent   Progression:  Unchanged   Chronicity:  New Severity:  Mild Onset quality:  Gradual Duration:  2 weeks Timing:  Constant Progression:  Unchanged Chronicity:  New Relieved by:  Nothing Worsened by:  Breathing Ineffective treatments:  None tried Associated symptoms: no headaches   Risk factors: diabetes mellitus   HPI Comments: [name] is a 68 y.o. male who presents to the Emergency Department complaining of a persistent, productive cough onset two weeks ago. He states that his sputum is white. He denies body aches, weakness, or other medical problems.    Past Medical History  Diagnosis Date  . Diabetes mellitus   . Hypertension    Past Surgical History  Procedure Laterality Date  . Abdominal surgery     History reviewed. No pertinent family history. History  Substance Use Topics  . Smoking status: Never Smoker   . Smokeless tobacco: Not on file  . Alcohol Use: No    Review of Systems  Constitutional: Negative for appetite change and fatigue.  HENT: Negative for congestion, ear discharge and sinus pressure.   Eyes: Negative for discharge.  Respiratory: Positive for cough and shortness of breath (mild).   Cardiovascular: Negative for chest pain.  Gastrointestinal: Negative for abdominal pain and diarrhea.   Genitourinary: Negative for frequency and hematuria.  Musculoskeletal: Negative for back pain.  Skin: Negative for rash.  Neurological: Negative for seizures and headaches.  Psychiatric/Behavioral: Negative for hallucinations.    Allergies  Review of patient's allergies indicates no known allergies.  Home Medications   Prior to Admission medications   Medication Sig Start Date End Date Taking? Authorizing Provider  albuterol (PROVENTIL HFA;VENTOLIN HFA) 108 (90 BASE) MCG/ACT inhaler Inhale 2 puffs into the lungs every 6 (six) hours as needed. Shortness of breath   Yes Historical Provider, MD  amLODipine (NORVASC) 10 MG tablet Take 10 mg by mouth daily.   Yes Historical Provider, MD  aspirin EC 81 MG tablet Take 81 mg by mouth daily.   Yes Historical Provider, MD  atorvastatin (LIPITOR) 80 MG tablet Take 80 mg by mouth daily.   Yes Historical Provider, MD  docusate sodium (COLACE) 100 MG capsule Take 1 capsule (100 mg total) by mouth every 12 (twelve) hours. 10/02/13  Yes Kristen N Ward, DO  enalapril (VASOTEC) 20 MG tablet Take 20 mg by mouth daily.   Yes Historical Provider, MD  furosemide (LASIX) 40 MG tablet Take 40 mg by mouth daily as needed for fluid.    Yes Historical Provider, MD  insulin glargine (LANTUS SOLOSTAR) 100 UNIT/ML injection Inject 14-16 Units into the skin at bedtime. 14 units in the morning and 16 units in the evening.   Yes Historical Provider, MD  metFORMIN (GLUCOPHAGE) 1000 MG  tablet Take 1,000 mg by mouth 2 (two) times daily.   Yes Historical Provider, MD  omeprazole (PRILOSEC) 20 MG capsule Take 20 mg by mouth daily.   Yes Historical Provider, MD  potassium chloride (MICRO-K) 10 MEQ CR capsule Take 10 mEq by mouth daily.   Yes Historical Provider, MD   Triage Vitals: BP 145/89  Pulse 78  Temp(Src) 98.2 F (36.8 C) (Oral)  Resp 18  Ht 5\' 7"  (1.702 m)  Wt 198 lb (89.812 kg)  BMI 31.00 kg/m2  SpO2 96% Physical Exam  Constitutional: He is oriented to  person, place, and time. He appears well-developed.  HENT:  Head: Normocephalic.  Eyes: Conjunctivae and EOM are normal. No scleral icterus.  Neck: Neck supple. No thyromegaly present.  Cardiovascular: Normal rate and regular rhythm.  Exam reveals no gallop and no friction rub.   No murmur heard. Pulmonary/Chest: No stridor. He has wheezes (mild, bilaterally). He has no rales. He exhibits no tenderness.  Abdominal: He exhibits no distension. There is no tenderness. There is no rebound.  Musculoskeletal: Normal range of motion. He exhibits no edema.  Lymphadenopathy:    He has no cervical adenopathy.  Neurological: He is oriented to person, place, and time. He exhibits normal muscle tone. Coordination normal.  Skin: No rash noted. No erythema.  Psychiatric: He has a normal mood and affect. His behavior is normal.    ED Course  Procedures (including critical care time) DIAGNOSTIC STUDIES: Oxygen Saturation is 96% on room air, normal by my interpretation.    COORDINATION OF CARE: 1:17 PM-Discussed treatment plan which includes a breathing treatment with pt at bedside and pt agreed to plan.   Labs Review Labs Reviewed - No data to display  Imaging Review Dg Chest 2 View  11/26/2013   CLINICAL DATA:  Upper respiratory infection with cough and congestion.  EXAM: CHEST - 2 VIEW  COMPARISON:  Chest x-ray from an abdominal series dated 04/02/2013  FINDINGS: Stable bibasilar scarring. There is no evidence of pulmonary edema, consolidation, pneumothorax, nodule or pleural fluid. Right-sided aortic arch again noted. The heart size is normal. The visualized skeletal structures are unremarkable.  IMPRESSION: No active disease.   Electronically Signed   By: 05/31/2013 M.D.   On: 11/26/2013 13:03     EKG Interpretation None      MDM   Final diagnoses:  None    tx with albuterol and amox  The chart was scribed for me under my direct supervision.  I personally performed the history,  physical, and medical decision making and all procedures in the evaluation of this patient.01/26/2014, MD 11/26/13 1539

## 2013-11-26 NOTE — Discharge Instructions (Signed)
Follow up with your md this week. °

## 2013-11-30 ENCOUNTER — Inpatient Hospital Stay (HOSPITAL_COMMUNITY)
Admission: EM | Admit: 2013-11-30 | Discharge: 2013-12-03 | DRG: 202 | Disposition: A | Discharge status: Home / Self Care | Payer: Medicare Other | Attending: Family Medicine | Admitting: Family Medicine

## 2013-11-30 ENCOUNTER — Emergency Department (HOSPITAL_COMMUNITY): Payer: Medicare Other

## 2013-11-30 ENCOUNTER — Encounter (HOSPITAL_COMMUNITY): Payer: Self-pay | Admitting: Emergency Medicine

## 2013-11-30 DIAGNOSIS — J441 Chronic obstructive pulmonary disease with (acute) exacerbation: Secondary | ICD-10-CM | POA: Insufficient documentation

## 2013-11-30 DIAGNOSIS — I509 Heart failure, unspecified: Secondary | ICD-10-CM | POA: Diagnosis present

## 2013-11-30 DIAGNOSIS — E119 Type 2 diabetes mellitus without complications: Secondary | ICD-10-CM | POA: Diagnosis present

## 2013-11-30 DIAGNOSIS — E785 Hyperlipidemia, unspecified: Secondary | ICD-10-CM | POA: Diagnosis present

## 2013-11-30 DIAGNOSIS — Z23 Encounter for immunization: Secondary | ICD-10-CM | POA: Diagnosis not present

## 2013-11-30 DIAGNOSIS — I10 Essential (primary) hypertension: Secondary | ICD-10-CM | POA: Diagnosis present

## 2013-11-30 DIAGNOSIS — Z7982 Long term (current) use of aspirin: Secondary | ICD-10-CM | POA: Diagnosis not present

## 2013-11-30 DIAGNOSIS — J45901 Unspecified asthma with (acute) exacerbation: Secondary | ICD-10-CM | POA: Diagnosis present

## 2013-11-30 DIAGNOSIS — Z794 Long term (current) use of insulin: Secondary | ICD-10-CM

## 2013-11-30 DIAGNOSIS — J209 Acute bronchitis, unspecified: Secondary | ICD-10-CM | POA: Diagnosis present

## 2013-11-30 DIAGNOSIS — Z79899 Other long term (current) drug therapy: Secondary | ICD-10-CM

## 2013-11-30 DIAGNOSIS — I5033 Acute on chronic diastolic (congestive) heart failure: Secondary | ICD-10-CM | POA: Diagnosis present

## 2013-11-30 DIAGNOSIS — R0602 Shortness of breath: Secondary | ICD-10-CM | POA: Diagnosis present

## 2013-11-30 DIAGNOSIS — I5030 Unspecified diastolic (congestive) heart failure: Secondary | ICD-10-CM | POA: Diagnosis present

## 2013-11-30 HISTORY — DX: Bronchitis, not specified as acute or chronic: J40

## 2013-11-30 LAB — BASIC METABOLIC PANEL
ANION GAP: 11 (ref 5–15)
BUN: 11 mg/dL (ref 6–23)
CHLORIDE: 104 meq/L (ref 96–112)
CO2: 29 meq/L (ref 19–32)
CREATININE: 0.81 mg/dL (ref 0.50–1.35)
Calcium: 9.3 mg/dL (ref 8.4–10.5)
GFR calc Af Amer: >90 mL/min (ref >90)
GFR calc non Af Amer: >90 mL/min (ref >90)
Glucose, Bld: 218 mg/dL — ABNORMAL HIGH (ref 70–99)
Potassium: 3.7 mEq/L (ref 3.7–5.3)
Sodium: 144 mEq/L (ref 137–147)

## 2013-11-30 LAB — CBC WITH DIFFERENTIAL/PLATELET
BASOS PCT: 0 % (ref 0–1)
Basophils Absolute: 0 10*3/uL (ref 0.0–0.1)
Eosinophils Absolute: 1.2 10*3/uL — ABNORMAL HIGH (ref 0.0–0.7)
Eosinophils Relative: 14 % — ABNORMAL HIGH (ref 0–5)
HEMATOCRIT: 38.1 % — AB (ref 39.0–52.0)
HEMOGLOBIN: 13.2 g/dL (ref 13.0–17.0)
LYMPHS PCT: 25 % (ref 12–46)
Lymphs Abs: 2.1 10*3/uL (ref 0.7–4.0)
MCH: 26.8 pg (ref 26.0–34.0)
MCHC: 34.6 g/dL (ref 30.0–36.0)
MCV: 77.4 fL — ABNORMAL LOW (ref 78.0–100.0)
MONO ABS: 0.6 10*3/uL (ref 0.1–1.0)
MONOS PCT: 7 % (ref 3–12)
Neutro Abs: 4.4 10*3/uL (ref 1.7–7.7)
Neutrophils Relative %: 54 % (ref 43–77)
Platelets: 184 10*3/uL (ref 150–400)
RBC: 4.92 MIL/uL (ref 4.22–5.81)
RDW: 14.7 % (ref 11.5–15.5)
WBC: 8.2 10*3/uL (ref 4.0–10.5)

## 2013-11-30 LAB — GLUCOSE, CAPILLARY
Glucose-Capillary: 369 mg/dL — ABNORMAL HIGH (ref 70–99)
Glucose-Capillary: 377 mg/dL — ABNORMAL HIGH (ref 70–99)
Glucose-Capillary: 340 mg/dL — ABNORMAL HIGH (ref 70–99)
Glucose-Capillary: 195 mg/dL — ABNORMAL HIGH (ref 70–99)

## 2013-11-30 LAB — HEMOGLOBIN A1C
Hgb A1c MFr Bld: 9.6 % — ABNORMAL HIGH (ref <5.7)
Mean Plasma Glucose: 229 mg/dL — ABNORMAL HIGH (ref <117)

## 2013-11-30 LAB — PRO B NATRIURETIC PEPTIDE: Pro B Natriuretic peptide (BNP): 80.8 pg/mL (ref 0–125)

## 2013-11-30 MED ORDER — ASPIRIN EC 81 MG PO TBEC
81.0000 mg | DELAYED_RELEASE_TABLET | Freq: Every day | ORAL | Status: DC
  Administered 2013-11-30 – 2013-12-03 (×4): 81 mg via ORAL
  Filled 2013-11-30 (×4): qty 1

## 2013-11-30 MED ORDER — IPRATROPIUM-ALBUTEROL 0.5-2.5 (3) MG/3ML IN SOLN
3.0000 mL | Freq: Once | RESPIRATORY_TRACT | Status: AC
  Administered 2013-11-30: 3 mL via RESPIRATORY_TRACT
  Filled 2013-11-30: qty 3

## 2013-11-30 MED ORDER — INSULIN GLARGINE 100 UNIT/ML ~~LOC~~ SOLN
14.0000 [IU] | Freq: Two times a day (BID) | SUBCUTANEOUS | Status: DC
  Administered 2013-11-30 – 2013-12-02 (×6): 14 [IU] via SUBCUTANEOUS
  Filled 2013-11-30 (×9): qty 0.14

## 2013-11-30 MED ORDER — IPRATROPIUM BROMIDE 0.02 % IN SOLN
0.5000 mg | Freq: Three times a day (TID) | RESPIRATORY_TRACT | Status: DC
  Administered 2013-11-30 – 2013-12-03 (×8): 0.5 mg via RESPIRATORY_TRACT
  Filled 2013-11-30 (×8): qty 2.5

## 2013-11-30 MED ORDER — ENOXAPARIN SODIUM 40 MG/0.4ML ~~LOC~~ SOLN
40.0000 mg | SUBCUTANEOUS | Status: DC
  Administered 2013-11-30 – 2013-12-03 (×4): 40 mg via SUBCUTANEOUS
  Filled 2013-11-30 (×4): qty 0.4

## 2013-11-30 MED ORDER — LEVALBUTEROL HCL 0.63 MG/3ML IN NEBU
0.6300 mg | INHALATION_SOLUTION | Freq: Four times a day (QID) | RESPIRATORY_TRACT | Status: DC
Start: 2013-11-30 — End: 2013-11-30

## 2013-11-30 MED ORDER — FUROSEMIDE 40 MG PO TABS
40.0000 mg | ORAL_TABLET | Freq: Every day | ORAL | Status: DC
  Administered 2013-12-01 – 2013-12-03 (×3): 40 mg via ORAL
  Filled 2013-11-30 (×3): qty 1

## 2013-11-30 MED ORDER — INSULIN ASPART 100 UNIT/ML ~~LOC~~ SOLN: 0.0000 [IU] | Freq: Three times a day (TID) | SUBCUTANEOUS | Status: DC

## 2013-11-30 MED ORDER — METHYLPREDNISOLONE SODIUM SUCC 125 MG IJ SOLR
125.0000 mg | Freq: Two times a day (BID) | INTRAMUSCULAR | Status: DC
  Administered 2013-11-30 – 2013-12-01 (×2): 125 mg via INTRAVENOUS
  Filled 2013-11-30 (×2): qty 2

## 2013-11-30 MED ORDER — IPRATROPIUM-ALBUTEROL 0.5-2.5 (3) MG/3ML IN SOLN
3.0000 mL | Freq: Once | RESPIRATORY_TRACT | Status: AC
  Administered 2013-11-30: 3 mL via RESPIRATORY_TRACT

## 2013-11-30 MED ORDER — ALBUTEROL SULFATE (2.5 MG/3ML) 0.083% IN NEBU: 2.5000 mg | INHALATION_SOLUTION | RESPIRATORY_TRACT | Status: DC | PRN

## 2013-11-30 MED ORDER — ALBUTEROL SULFATE (2.5 MG/3ML) 0.083% IN NEBU
2.5000 mg | INHALATION_SOLUTION | Freq: Once | RESPIRATORY_TRACT | Status: AC
  Administered 2013-11-30: 2.5 mg via RESPIRATORY_TRACT

## 2013-11-30 MED ORDER — LEVOFLOXACIN IN D5W 750 MG/150ML IV SOLN
750.0000 mg | INTRAVENOUS | Status: DC
  Administered 2013-11-30 – 2013-12-02 (×3): 750 mg via INTRAVENOUS
  Filled 2013-11-30 (×3): qty 150

## 2013-11-30 MED ORDER — ATORVASTATIN CALCIUM 40 MG PO TABS
80.0000 mg | ORAL_TABLET | Freq: Every day | ORAL | Status: DC
  Administered 2013-11-30 – 2013-12-03 (×4): 80 mg via ORAL
  Filled 2013-11-30 (×5): qty 2

## 2013-11-30 MED ORDER — IPRATROPIUM BROMIDE 0.02 % IN SOLN: 0.5000 mg | Freq: Four times a day (QID) | RESPIRATORY_TRACT | Status: DC

## 2013-11-30 MED ORDER — IPRATROPIUM-ALBUTEROL 0.5-2.5 (3) MG/3ML IN SOLN
RESPIRATORY_TRACT | Status: AC
  Filled 2013-11-30: qty 3

## 2013-11-30 MED ORDER — IPRATROPIUM BROMIDE 0.02 % IN SOLN
0.5000 mg | Freq: Four times a day (QID) | RESPIRATORY_TRACT | Status: DC
  Administered 2013-11-30 (×2): 0.5 mg via RESPIRATORY_TRACT
  Filled 2013-11-30 (×2): qty 2.5

## 2013-11-30 MED ORDER — PANTOPRAZOLE SODIUM 40 MG PO TBEC
40.0000 mg | DELAYED_RELEASE_TABLET | Freq: Every day | ORAL | Status: DC
  Administered 2013-11-30 – 2013-12-03 (×4): 40 mg via ORAL
  Filled 2013-11-30 (×4): qty 1

## 2013-11-30 MED ORDER — LEVALBUTEROL HCL 0.63 MG/3ML IN NEBU
0.6300 mg | INHALATION_SOLUTION | Freq: Three times a day (TID) | RESPIRATORY_TRACT | Status: DC
  Administered 2013-11-30 – 2013-12-03 (×8): 0.63 mg via RESPIRATORY_TRACT
  Filled 2013-11-30 (×8): qty 3

## 2013-11-30 MED ORDER — INSULIN ASPART 100 UNIT/ML ~~LOC~~ SOLN
0.0000 [IU] | Freq: Three times a day (TID) | SUBCUTANEOUS | Status: DC
  Administered 2013-11-30: 15 [IU] via SUBCUTANEOUS
  Administered 2013-12-01: 11 [IU] via SUBCUTANEOUS
  Administered 2013-12-01: 15 [IU] via SUBCUTANEOUS
  Administered 2013-12-01: 11 [IU] via SUBCUTANEOUS
  Administered 2013-12-02: 15 [IU] via SUBCUTANEOUS
  Administered 2013-12-02: 7 [IU] via SUBCUTANEOUS
  Administered 2013-12-02: 11 [IU] via SUBCUTANEOUS
  Administered 2013-12-03: 4 [IU] via SUBCUTANEOUS

## 2013-11-30 MED ORDER — INFLUENZA VAC SPLIT QUAD 0.5 ML IM SUSY
0.5000 mL | PREFILLED_SYRINGE | INTRAMUSCULAR | Status: AC
  Administered 2013-12-01: 0.5 mL via INTRAMUSCULAR
  Filled 2013-11-30: qty 0.5

## 2013-11-30 MED ORDER — LEVALBUTEROL HCL 0.63 MG/3ML IN NEBU
0.6300 mg | INHALATION_SOLUTION | Freq: Four times a day (QID) | RESPIRATORY_TRACT | Status: DC
Start: 2013-11-30 — End: 2013-11-30
  Administered 2013-11-30 (×2): 0.63 mg via RESPIRATORY_TRACT
  Filled 2013-11-30 (×2): qty 3

## 2013-11-30 MED ORDER — METFORMIN HCL 500 MG PO TABS
1000.0000 mg | ORAL_TABLET | Freq: Two times a day (BID) | ORAL | Status: DC
  Administered 2013-11-30 – 2013-12-03 (×7): 1000 mg via ORAL
  Filled 2013-11-30 (×8): qty 2

## 2013-11-30 MED ORDER — ALBUTEROL SULFATE (2.5 MG/3ML) 0.083% IN NEBU
INHALATION_SOLUTION | RESPIRATORY_TRACT | Status: AC
  Filled 2013-11-30: qty 3

## 2013-11-30 MED ORDER — ONDANSETRON HCL 4 MG/2ML IJ SOLN: 4.0000 mg | Freq: Three times a day (TID) | INTRAMUSCULAR | Status: AC | PRN

## 2013-11-30 MED ORDER — INSULIN ASPART 100 UNIT/ML ~~LOC~~ SOLN
0.0000 [IU] | Freq: Three times a day (TID) | SUBCUTANEOUS | Status: DC
  Administered 2013-11-30: 15 [IU] via SUBCUTANEOUS
  Administered 2013-11-30: 09:00:00 via SUBCUTANEOUS

## 2013-11-30 MED ORDER — FUROSEMIDE 10 MG/ML IJ SOLN
40.0000 mg | Freq: Once | INTRAMUSCULAR | Status: AC
  Administered 2013-11-30: 40 mg via INTRAVENOUS
  Filled 2013-11-30: qty 4

## 2013-11-30 MED ORDER — AMLODIPINE BESYLATE 5 MG PO TABS
10.0000 mg | ORAL_TABLET | Freq: Every day | ORAL | Status: DC
  Administered 2013-11-30 – 2013-12-03 (×4): 10 mg via ORAL
  Filled 2013-11-30 (×4): qty 2

## 2013-11-30 MED ORDER — METHYLPREDNISOLONE SODIUM SUCC 125 MG IJ SOLR
125.0000 mg | Freq: Two times a day (BID) | INTRAMUSCULAR | Status: DC
  Administered 2013-11-30: 125 mg via INTRAVENOUS
  Filled 2013-11-30: qty 2

## 2013-11-30 MED ORDER — METHYLPREDNISOLONE SODIUM SUCC 125 MG IJ SOLR
125.0000 mg | Freq: Once | INTRAMUSCULAR | Status: AC
  Administered 2013-11-30: 125 mg via INTRAVENOUS
  Filled 2013-11-30: qty 2

## 2013-11-30 NOTE — Progress Notes (Signed)
Inpatient Diabetes Program Recommendations  AACE/ADA: New Consensus Statement on Inpatient Glycemic Control (2013)  Target Ranges:  Prepandial:   less than 140 mg/dL      Peak postprandial:   less than 180 mg/dL (1-2 hours)      Critically ill patients:  140 - 180 mg/dL   Results for Tavella, LEROI (MRN [id number]) as of 11/30/2013 13:04  Ref. Range 11/30/2013 08:28 11/30/2013 11:33  Glucose-Capillary Latest Range: 70-99 mg/dL 403 (H) 474 (H)   Diabetes history: DM2 Outpatient Diabetes medications: Lantus 14 units QAM, Lantus 16 units QPM, Metformin 1000 mg BID Current orders for Inpatient glycemic control: Lantus 14 units BID, Novolog 0-15 units AC, Metformin 1000 mg BID  Inpatient Diabetes Program Recommendations Insulin - Basal: Please continue to adjust Lantus until fasting glucose is less than 180 mg/dl. Correction (SSI): Please consider increasing Novolog correction to resistant scale. Insulin - Meal Coverage: While ordered steroids, please consider ordering Novolog 5 units TID with meals for meal coverage.  Thanks, Orlando Penner, RN, MSN, CCRN Diabetes Coordinator Inpatient Diabetes Program [phone] (Team Pager) [phone] ([facility] office) [phone] [facility] office)

## 2013-11-30 NOTE — Progress Notes (Signed)
  Murphy Duzan JGG:[id number] DOB: 03/18/1946 DOA: 11/30/2013 PCP: Milana Obey, MD   Subjective: This man was admitted earlier this morning with the acute bronchitis/bronchospasm. He still feels dyspneic.           Physical Exam: Blood pressure 143/78, pulse 63, temperature 98 F (36.7 C), temperature source Oral, resp. rate 21, height 5\' 7"  (1.702 m), weight 89.812 kg (198 lb), SpO2 97.00%. He is slightly short of breath on minimal exertion. Lung fields show bilateral wheezing which is somewhat tight. Jugular venous pressure not elevated. There is no gallop rhythm. He has no clinical features of heart failure. He is alert and orientated.   Investigations:  No results found for this or any previous visit (from the past 240 hour(s)).   Basic Metabolic Panel:  Recent Labs  0233  NA 144  K 3.7  CL 104  CO2 29  GLUCOSE 218*  BUN 11  CREATININE 0.81  CALCIUM 9.3      CBC:  Recent Labs  11/30/13 0233  WBC 8.2  NEUTROABS 4.4  HGB 13.2  HCT 38.1*  MCV 77.4*  PLT 184    Dg Chest Portable 1 View  11/30/2013   CLINICAL DATA:  Shortness of breath. Cough. Diaphoretic for 1 week.  EXAM: PORTABLE CHEST - 1 VIEW  COMPARISON:  11/26/2013  FINDINGS: Shallow inspiration. The heart size and mediastinal contours are within normal limits. Both lungs are clear. The visualized skeletal structures are unremarkable.  IMPRESSION: No active disease.   Electronically Signed   By: 01/26/2014 M.D.   On: 11/30/2013 02:51      Medications: I have reviewed the patient's current medications.  Impression: 1. Acute bronchitis with severe bronchospasm. 2.History of asthma with probable exacerbation. 3. Hypertension. 4. Diabetes mellitus. 5. History of heart failure, diastolic, currently clinically compensated. BNP is not elevated and chest x-ray is not indicative of pulmonary edema. Clinical features are not consistent with this either.     Plan: 1. Continue  with IV steroids and antibiotics. 2. Monitor diabetes closely in view of the steroids. 3. I suspect he should be able to be discharged in the next 24-48 hours.  Consultants:  None.   Procedures:  None.   Antibiotics:  IV Levaquin.                   Code Status: Full code.  Family Communication: I discussed the plan with patient at the bedside.   Disposition Plan: Home when medically stable.  Time spent: 15 minutes.   LOS: 0 days   Radley Teston C   11/30/2013, 8:24 AM

## 2013-11-30 NOTE — ED Notes (Signed)
Pt was given a second dose of Solumedrol 125 mg two hours after initial dosage, per [facility] informed physician, I contacted Hospitalist Dr. Elvera Lennox, and he stated that was fine.

## 2013-11-30 NOTE — H&P (Signed)
History and Physical    [name] UJW:[id number] DOB: [birth year] DOA: 11/30/2013  Referring physician: Dr. Preston Fleeting PCP: Milana Obey, MD  Specialists: none  Chief Complaint: shortness of breath, cough  HPI: [name] is a 68 y.o. male has a past medical history significant for asthma, IDDM, HTN, presents to the ED with a chief complaint of shortness of breath. He has been feeling poorly for the past week with increased cough with white sputum production, and was seen in the emergency room about 3 days ago with similar complaints, responded well to nebulizers at that time, felt better, and was discharged home on amoxicillin. Because of his history of diabetes mellitus, he did not get steroids. At home, over the last couple of days, he has been getting progressively short of breath, with significant wheezing and persistent cough so he see decided to come back. Patient is a rather poor historian, but denies any fever or chills, denies any chest pain, has no GI or GU complaints. In the emergency room, patient initially unable to speak in full sentences, responded well to continue his nebulizer treatments, more comfortable but still with significant wheezing, and tried hospitalist was asked for admission.  Patient denies history of COPD and states he has asthma, he is a rather poor historian and I see no pulmonary function tests in Epic. He also has a history of diabetes mellitus, and tells me he takes 2 "pills" per day as well as to "shots" per day. He unfortunately does not know how much insulin he takes, he tells me he draws in the syringe "about this much" showing a representation in air. He also tells me that he's been taking "a fluid pill" and he takes that every day, twice a day.  Review of Systems: As per history of present illness, otherwise negative  Past Medical History  Diagnosis Date  . Diabetes mellitus   . Hypertension   . Bronchitis    Past Surgical History  Procedure  Laterality Date  . Abdominal surgery     Social History:  reports that he has never smoked. He does not have any smokeless tobacco history on file. He reports that he does not drink alcohol or use illicit drugs.  No Known Allergies  History reviewed. No pertinent family history.  Prior to Admission medications   Medication Sig Start Date End Date Taking? Authorizing Provider  albuterol (PROVENTIL HFA;VENTOLIN HFA) 108 (90 BASE) MCG/ACT inhaler Inhale 2 puffs into the lungs every 6 (six) hours as needed. Shortness of breath    Historical Provider, MD  albuterol (PROVENTIL HFA;VENTOLIN HFA) 108 (90 BASE) MCG/ACT inhaler Inhale 1-2 puffs into the lungs every 6 (six) hours as needed for wheezing or shortness of breath. 11/26/13   Benny Lennert, MD  amLODipine (NORVASC) 10 MG tablet Take 10 mg by mouth daily.    Historical Provider, MD  amoxicillin (AMOXIL) 500 MG capsule Take 1 capsule (500 mg total) by mouth 3 (three) times daily. 11/26/13   Benny Lennert, MD  aspirin EC 81 MG tablet Take 81 mg by mouth daily.    Historical Provider, MD  atorvastatin (LIPITOR) 80 MG tablet Take 80 mg by mouth daily.    Historical Provider, MD  docusate sodium (COLACE) 100 MG capsule Take 1 capsule (100 mg total) by mouth every 12 (twelve) hours. 10/02/13   Kristen N Ward, DO  enalapril (VASOTEC) 20 MG tablet Take 20 mg by mouth daily.    Historical Provider, MD  furosemide (  LASIX) 40 MG tablet Take 40 mg by mouth daily as needed for fluid.     Historical Provider, MD  insulin glargine (LANTUS SOLOSTAR) 100 UNIT/ML injection Inject 14-16 Units into the skin at bedtime. 14 units in the morning and 16 units in the evening.    Historical Provider, MD  metFORMIN (GLUCOPHAGE) 1000 MG tablet Take 1,000 mg by mouth 2 (two) times daily.    Historical Provider, MD  omeprazole (PRILOSEC) 20 MG capsule Take 20 mg by mouth daily.    Historical Provider, MD  potassium chloride (MICRO-K) 10 MEQ CR capsule Take 10 mEq by mouth  daily.    Historical Provider, MD   Physical Exam: Filed Vitals:   11/30/13 1610 11/30/13 0238 11/30/13 0306 11/30/13 0353  BP:      Pulse:      Temp:      TempSrc:      Resp:      Weight:      SpO2: 99% 98% 96% 98%     General:  No apparent distress, coughing when I entered the room, audible wheezing  Eyes: no scleral icterus  Neck: supple, no JVD  Cardiovascular: regular rate without MRG; 2+ peripheral pulses, tachycardic  Respiratory: Diffuse wheezing throughout bilateral lung fields, good air movement, no crackles  Abdomen: soft, non tender to palpation, positive bowel sounds  Skin: no rashes  Musculoskeletal: Trace lower extremity edema  Neurologic: Nonfocal  Labs on Admission:  Basic Metabolic Panel:  Recent Labs Lab 11/30/13 0233  NA 144  K 3.7  CL 104  CO2 29  GLUCOSE 218*  BUN 11  CREATININE 0.81  CALCIUM 9.3    CBC:  Recent Labs Lab 11/30/13 0233  WBC 8.2  NEUTROABS 4.4  HGB 13.2  HCT 38.1*  MCV 77.4*  PLT 184   Radiological Exams on Admission: Dg Chest Portable 1 View  11/30/2013   CLINICAL DATA:  Shortness of breath. Cough. Diaphoretic for 1 week.  EXAM: PORTABLE CHEST - 1 VIEW  COMPARISON:  11/26/2013  FINDINGS: Shallow inspiration. The heart size and mediastinal contours are within normal limits. Both lungs are clear. The visualized skeletal structures are unremarkable.  IMPRESSION: No active disease.   Electronically Signed   By: Burman Nieves M.D.   On: 11/30/2013 02:51    EKG: Independently reviewed. Sinus tachycardia  Assessment/Plan Active Problems:   Acute bronchitis with bronchospasm   Asthma exacerbation   Diabetes mellitus   HTN (hypertension)   HLD (hyperlipidemia)   Acute on chronic diastolic heart failure   #1 Acute bronchitis with bronchospasm / asthma exacerbation - patient with progressive wheezing for the past week, worsening cough with sputum production, will admit to the hospital for nebulizers with  ipratropium and Xopenex (given significant tachycardia with the Albuterol in the ED), IV steroids and levofloxacin. Closely monitor her respiratory status.  #2 Insulin-dependent diabetes mellitus - restart his metformin, as well as his Lantus twice a day, I see that he is on 14 units in the morning and 16 units in the evening, however patient cannot confirm this dosing. Will start 14 units twice a day as well as a moderate sliding scale insulin. He is on steroids as per #1, will monitor CBGs and titrate insulin as needed.  #3 Acute on chronic diastolic heart failure - clinically patient has lower extremity edema, chest x-ray does not look positive for frank pulmonary edema, however he seems clinically a bit fluid overloaded. Last 2-D echo that we have in the  system is from 2011 and showed a normal ejection fraction a grade 2 diastolic dysfunction. We'll resume his oral Lasix Tomorrow, and administer one time IV dose today. Obtain proBNP this morning.  #4 Hypertension - continue home medications   Diet: Heart modified Fluids: none  DVT Prophylaxis: Lovenox  Code Status: Presumed full code  Family Communication: None  Disposition Plan: Admit to telemetry  Time spent: 80  This note has been created with Education officer, environmental. Any transcriptional errors are unintentional.   Bethann Qualley M. Elvera Lennox, MD [facility] Pager [phone]  If 7PM-7AM, please contact night-coverage [email or web address] Password [email or web address] 11/30/2013, 5:02 AM

## 2013-11-30 NOTE — ED Provider Notes (Signed)
CSN: [id number]     Arrival date & time 11/30/13  0212 History   First MD Initiated Contact with Patient 11/30/13 0225     Chief Complaint  Patient presents with  . Shortness of Breath     (Consider location/radiation/quality/duration/timing/severity/associated sxs/prior Treatment) Patient is a 68 y.o. male presenting with shortness of breath. The history is provided by the patient.  Shortness of Breath He is a somewhat poor historian, but he had been seen in the ED 3 days ago for otitis and prescribed an albuterol inhaler. At some point, symptoms got worse-he is very unclear as to when that happened. He continues to have a cough productive of clear sputum. He denies fever chills or sweats. He is complaining of a mild headache. There's been no nausea or vomiting. He is not complaining of any other pain.  Past Medical History  Diagnosis Date  . Diabetes mellitus   . Hypertension   . Bronchitis    Past Surgical History  Procedure Laterality Date  . Abdominal surgery     History reviewed. No pertinent family history. History  Substance Use Topics  . Smoking status: Never Smoker   . Smokeless tobacco: Not on file  . Alcohol Use: No    Review of Systems  Respiratory: Positive for shortness of breath.   All other systems reviewed and are negative.     Allergies  Review of patient's allergies indicates no known allergies.  Home Medications   Prior to Admission medications   Medication Sig Start Date End Date Taking? Authorizing Provider  albuterol (PROVENTIL HFA;VENTOLIN HFA) 108 (90 BASE) MCG/ACT inhaler Inhale 2 puffs into the lungs every 6 (six) hours as needed. Shortness of breath    Historical Provider, MD  albuterol (PROVENTIL HFA;VENTOLIN HFA) 108 (90 BASE) MCG/ACT inhaler Inhale 1-2 puffs into the lungs every 6 (six) hours as needed for wheezing or shortness of breath. 11/26/13   Benny Lennert, MD  amLODipine (NORVASC) 10 MG tablet Take 10 mg by mouth daily.     Historical Provider, MD  amoxicillin (AMOXIL) 500 MG capsule Take 1 capsule (500 mg total) by mouth 3 (three) times daily. 11/26/13   Benny Lennert, MD  aspirin EC 81 MG tablet Take 81 mg by mouth daily.    Historical Provider, MD  atorvastatin (LIPITOR) 80 MG tablet Take 80 mg by mouth daily.    Historical Provider, MD  docusate sodium (COLACE) 100 MG capsule Take 1 capsule (100 mg total) by mouth every 12 (twelve) hours. 10/02/13   Kristen N Ward, DO  enalapril (VASOTEC) 20 MG tablet Take 20 mg by mouth daily.    Historical Provider, MD  furosemide (LASIX) 40 MG tablet Take 40 mg by mouth daily as needed for fluid.     Historical Provider, MD  insulin glargine (LANTUS SOLOSTAR) 100 UNIT/ML injection Inject 14-16 Units into the skin at bedtime. 14 units in the morning and 16 units in the evening.    Historical Provider, MD  metFORMIN (GLUCOPHAGE) 1000 MG tablet Take 1,000 mg by mouth 2 (two) times daily.    Historical Provider, MD  omeprazole (PRILOSEC) 20 MG capsule Take 20 mg by mouth daily.    Historical Provider, MD  potassium chloride (MICRO-K) 10 MEQ CR capsule Take 10 mEq by mouth daily.    Historical Provider, MD   BP 154/105  Pulse 112  Temp(Src) 98 F (36.7 C) (Oral)  Resp 28  Wt 198 lb (89.812 kg)  SpO2 99% Physical Exam  Nursing note and vitals reviewed.  69 year old male, in mild respiratory distress. Vital signs are significant for tachycardia, tachypnea, and hypertension. Oxygen saturation is 99%, which is normal. Head is normocephalic and atraumatic. PERRLA, EOMI. Oropharynx is clear. Neck is nontender and supple without adenopathy or JVD. Back is nontender and there is no CVA tenderness. Lungs have diminished airflow with diffuse expiratory and expiratory wheezes. There are no rales or rhonchi. Chest is nontender. Heart has regular rate and rhythm with 2/6 systolic ejection murmur at the cardiac base. Abdomen is soft, flat, nontender without masses or hepatosplenomegaly  and peristalsis is normoactive. Extremities have no cyanosis or edema, full range of motion is present. Skin is warm and dry without rash. Neurologic: Mental status is normal, cranial nerves are intact, there are no motor or sensory deficits.  ED Course  Procedures (including critical care time) Labs Review Results for orders placed during the hospital encounter of 11/30/13  CBC WITH DIFFERENTIAL      Result Value Ref Range   WBC 8.2  4.0 - 10.5 K/uL   RBC 4.92  4.22 - 5.81 MIL/uL   Hemoglobin 13.2  13.0 - 17.0 g/dL   HCT 11.5 (*) 72.6 - 20.3 %   MCV 77.4 (*) 78.0 - 100.0 fL   MCH 26.8  26.0 - 34.0 pg   MCHC 34.6  30.0 - 36.0 g/dL   RDW 55.9  74.1 - 63.8 %   Platelets 184  150 - 400 K/uL   Neutrophils Relative % 54  43 - 77 %   Neutro Abs 4.4  1.7 - 7.7 K/uL   Lymphocytes Relative 25  12 - 46 %   Lymphs Abs 2.1  0.7 - 4.0 K/uL   Monocytes Relative 7  3 - 12 %   Monocytes Absolute 0.6  0.1 - 1.0 K/uL   Eosinophils Relative 14 (*) 0 - 5 %   Eosinophils Absolute 1.2 (*) 0.0 - 0.7 K/uL   Basophils Relative 0  0 - 1 %   Basophils Absolute 0.0  0.0 - 0.1 K/uL   Imaging Review Dg Chest Portable 1 View  11/30/2013   CLINICAL DATA:  Shortness of breath. Cough. Diaphoretic for 1 week.  EXAM: PORTABLE CHEST - 1 VIEW  COMPARISON:  11/26/2013  FINDINGS: Shallow inspiration. The heart size and mediastinal contours are within normal limits. Both lungs are clear. The visualized skeletal structures are unremarkable.  IMPRESSION: No active disease.   Electronically Signed   By: Burman Nieves M.D.   On: 11/30/2013 02:51   Images viewed by me.   EKG Interpretation   Date/Time:  Friday November 30 2013 02:22:50 EDT Ventricular Rate:  92 PR Interval:  219 QRS Duration: 96 QT Interval:  349 QTC Calculation: 432 R Axis:   73 Text Interpretation:  Sinus rhythm Atrial premature complex Borderline  prolonged PR interval Left atrial enlargement Probable anteroseptal  infarct, old When  compared with ECG of 10/02/2013, No significant change  was found Confirmed by [facility]  MD, Tamie Minteer ([id number]) on 11/30/2013 2:29:44 AM      CRITICAL CARE Performed by: WOEHO,ZYYQM Total critical care time: 40 minutes Critical care time was exclusive of separately billable procedures and treating other patients. Critical care was necessary to treat or prevent imminent or life-threatening deterioration. Critical care was time spent personally by me on the following activities: development of treatment plan with patient and/or surrogate as well as nursing, discussions with consultants, evaluation of patient's response to treatment, examination  of patient, obtaining history from patient or surrogate, ordering and performing treatments and interventions, ordering and review of laboratory studies, ordering and review of radiographic studies, pulse oximetry and re-evaluation of patient's condition.  MDM   Final diagnoses:  Bronchitis, acute, with bronchospasm    Dyspnea with bronchospasm. Old records are reviewed and he was seen 3 days ago for bronchitis with bronchospasm. He had modest improvement with albuterol with ipratropium nebulizer treatment here. He is given a dose of methylprednisolone and is given a second albuterol with ipratropium nebulizer treatment. Chest x-ray shows no evidence of pneumonia and WBC is normal.  After second nebulizer treatment, he was no longer using accessory muscles or respiration and cough had improve slightly but he tends to need to have significant dyspnea and moderate to severe wheezing. He was given a third nebulizer treatment with little change and arrangements are made to admit the patient. Case was then discussed with Dr. Elvera Lennox of [facility] who agrees to admit the patient.   Dione Booze, MD 11/30/13 [phone]

## 2013-11-30 NOTE — ED Notes (Signed)
Emergency Contact, Daughter-in-law Malachi Bonds, [place] [phone].

## 2013-11-30 NOTE — Clinical Social Work Note (Signed)
CSW received consult for transportation needs. Met with pt at bedside. Pt said that he lives in [place] and walks to doctor's appointments. Discussed [facility] with pt which he was not aware of. Notified him that [facility] requires several days notice for transportation and that it is several dollars. Pt appreciative and reports no other needs at this time. CSW will sign off, but can be reconsulted if needed.  Benay Pike, [place]

## 2013-11-30 NOTE — ED Notes (Signed)
Called pt's daughter in law and informed her he was being admitted and would be in room 332.

## 2013-11-30 NOTE — ED Notes (Signed)
Pt reporting cough and SOB.  Reports being seen for bronchitis last week, symptoms continue.

## 2013-11-30 NOTE — Progress Notes (Signed)
Patient's blood sugars have been between 340 and 380 today.  Dr. Felecia Shelling on-call and notified.  Gave order to change patient to resistant scale per Diabetes Coordinator recommendation.  Orders followed.

## 2013-12-01 DIAGNOSIS — J209 Acute bronchitis, unspecified: Secondary | ICD-10-CM | POA: Diagnosis not present

## 2013-12-01 LAB — COMPREHENSIVE METABOLIC PANEL
ALBUMIN: 3.5 g/dL (ref 3.5–5.2)
ALK PHOS: 107 U/L (ref 39–117)
ALT: 16 U/L (ref 0–53)
AST: 18 U/L (ref 0–37)
Anion gap: 12 (ref 5–15)
BUN: 20 mg/dL (ref 6–23)
CALCIUM: 9.5 mg/dL (ref 8.4–10.5)
CO2: 29 mEq/L (ref 19–32)
Chloride: 101 mEq/L (ref 96–112)
Creatinine, Ser: 0.87 mg/dL (ref 0.50–1.35)
GFR calc Af Amer: >90 mL/min (ref >90)
GFR calc non Af Amer: 88 mL/min — ABNORMAL LOW (ref >90)
Glucose, Bld: 270 mg/dL — ABNORMAL HIGH (ref 70–99)
POTASSIUM: 4 meq/L (ref 3.7–5.3)
SODIUM: 142 meq/L (ref 137–147)
Total Bilirubin: 0.4 mg/dL (ref 0.3–1.2)
Total Protein: 6.8 g/dL (ref 6.0–8.3)

## 2013-12-01 LAB — CBC
HEMATOCRIT: 36.9 % — AB (ref 39.0–52.0)
Hemoglobin: 12.5 g/dL — ABNORMAL LOW (ref 13.0–17.0)
MCH: 26.3 pg (ref 26.0–34.0)
MCHC: 33.9 g/dL (ref 30.0–36.0)
MCV: 77.7 fL — ABNORMAL LOW (ref 78.0–100.0)
Platelets: 172 10*3/uL (ref 150–400)
RBC: 4.75 MIL/uL (ref 4.22–5.81)
RDW: 14.6 % (ref 11.5–15.5)
WBC: 11.8 10*3/uL — ABNORMAL HIGH (ref 4.0–10.5)

## 2013-12-01 LAB — GLUCOSE, CAPILLARY
GLUCOSE-CAPILLARY: 347 mg/dL — AB (ref 70–99)
GLUCOSE-CAPILLARY: 345 mg/dL — AB (ref 70–99)
Glucose-Capillary: 300 mg/dL — ABNORMAL HIGH (ref 70–99)
Glucose-Capillary: 282 mg/dL — ABNORMAL HIGH (ref 70–99)

## 2013-12-01 MED ORDER — METHYLPREDNISOLONE SODIUM SUCC 40 MG IJ SOLR
40.0000 mg | Freq: Two times a day (BID) | INTRAMUSCULAR | Status: DC
  Administered 2013-12-01 – 2013-12-02 (×2): 40 mg via INTRAVENOUS
  Filled 2013-12-01 (×2): qty 1

## 2013-12-01 NOTE — Progress Notes (Signed)
Utilization review Completed Sanyla Summey RN BSN   

## 2013-12-01 NOTE — Plan of Care (Signed)
Problem: Phase I Progression Outcomes Goal: Flu/PneumoVaccines if indicated Outcome: Completed/Met Date Met:  12/01/13 Flu vaccine given today. Goal: Pain controlled Outcome: Completed/Met Date Met:  12/01/13 Patient denies being in pain. Goal: Pt OOB to Walk or Exercise Daily With Nursing or PT Patient OOB to walk or exercise daily with nursing or PT if activity order permits  Outcome: Completed/Met Date Met:  12/01/13 Patient gets up ad lib. Goal: Tolerating diet Outcome: Completed/Met Date Met:  12/01/13 Patient has been eating 75-100% of each meals.

## 2013-12-01 NOTE — Progress Notes (Signed)
Subjective: Patient was admitted with acute bronchitis. He is on IV antibiotics and IV steroid. Patient is feeling better today.  Objective: Vital signs in last 24 hours: Temp:  [98 F (36.7 C)] 98 F (36.7 C) (09/12 0515) Pulse Rate:  [74-107] 74 (09/12 0515) Resp:  [16-20] 16 (09/12 0515) BP: (142-165)/(69-78) 143/77 mmHg (09/12 0515) SpO2:  [90 %-98 %] 90 % (09/12 0822) Weight change:  Last BM Date: 11/28/13  Intake/Output from previous day: 09/11 0701 - 09/12 0700 In: -  Out: 1600 [Urine:1600]  PHYSICAL EXAM General appearance: alert and no distress Resp: diminished breath sounds bilaterally and rhonchi bilaterally Cardio: S1, S2 normal GI: soft, non-tender; bowel sounds normal; no masses,  no organomegaly Extremities: extremities normal, atraumatic, no cyanosis or edema  Lab Results:  Results for orders placed during the hospital encounter of 11/30/13 (from the past 48 hour(s))  CBC WITH DIFFERENTIAL     Status: Abnormal   Collection Time    11/30/13  2:33 AM      Result Value Ref Range   WBC 8.2  4.0 - 10.5 K/uL   RBC 4.92  4.22 - 5.81 MIL/uL   Hemoglobin 13.2  13.0 - 17.0 g/dL   HCT 38.1 (*) 39.0 - 52.0 %   MCV 77.4 (*) 78.0 - 100.0 fL   MCH 26.8  26.0 - 34.0 pg   MCHC 34.6  30.0 - 36.0 g/dL   RDW 14.7  11.5 - 15.5 %   Platelets 184  150 - 400 K/uL   Neutrophils Relative % 54  43 - 77 %   Neutro Abs 4.4  1.7 - 7.7 K/uL   Lymphocytes Relative 25  12 - 46 %   Lymphs Abs 2.1  0.7 - 4.0 K/uL   Monocytes Relative 7  3 - 12 %   Monocytes Absolute 0.6  0.1 - 1.0 K/uL   Eosinophils Relative 14 (*) 0 - 5 %   Eosinophils Absolute 1.2 (*) 0.0 - 0.7 K/uL   Basophils Relative 0  0 - 1 %   Basophils Absolute 0.0  0.0 - 0.1 K/uL  BASIC METABOLIC PANEL     Status: Abnormal   Collection Time    11/30/13  2:33 AM      Result Value Ref Range   Sodium 144  137 - 147 mEq/L   Potassium 3.7  3.7 - 5.3 mEq/L   Chloride 104  96 - 112 mEq/L   CO2 29  19 - 32 mEq/L   Glucose,  Bld 218 (*) 70 - 99 mg/dL   BUN 11  6 - 23 mg/dL   Creatinine, Ser 0.81  0.50 - 1.35 mg/dL   Calcium 9.3  8.4 - 10.5 mg/dL   GFR calc non Af Amer >90  >90 mL/min   GFR calc Af Amer >90  >90 mL/min   Comment: (NOTE)     The eGFR has been calculated using the CKD EPI equation.     This calculation has not been validated in all clinical situations.     eGFR's persistently <90 mL/min signify possible Chronic Kidney     Disease.   Anion gap 11  5 - 15  HEMOGLOBIN A1C     Status: Abnormal   Collection Time    11/30/13  5:06 AM      Result Value Ref Range   Hemoglobin A1C 9.6 (*) <5.7 %   Comment: (NOTE)  According to the ADA Clinical Practice Recommendations for 2011, when     HbA1c is used as a screening test:      >=6.5%   Diagnostic of Diabetes Mellitus               (if abnormal result is confirmed)     5.7-6.4%   Increased risk of developing Diabetes Mellitus     References:Diagnosis and Classification of Diabetes Mellitus,Diabetes     WUJW,1191,47(WGNFA 1):S62-S69 and Standards of Medical Care in             Diabetes - 2011,Diabetes OZHY,8657,84 (Suppl 1):S11-S61.   Mean Plasma Glucose 229 (*) <117 mg/dL   Comment: Performed at [place]     Status: None   Collection Time    11/30/13  5:29 AM      Result Value Ref Range   Pro B Natriuretic peptide (BNP) 80.8  0 - 125 pg/mL  GLUCOSE, CAPILLARY     Status: Abnormal   Collection Time    11/30/13  8:28 AM      Result Value Ref Range   Glucose-Capillary 369 (*) 70 - 99 mg/dL   Comment 1 Notify RN    GLUCOSE, CAPILLARY     Status: Abnormal   Collection Time    11/30/13 11:33 AM      Result Value Ref Range   Glucose-Capillary 377 (*) 70 - 99 mg/dL   Comment 1 Notify RN    GLUCOSE, CAPILLARY     Status: Abnormal   Collection Time    11/30/13  4:42 PM      Result Value Ref Range   Glucose-Capillary 340 (*) 70 -  99 mg/dL   Comment 1 Notify RN    GLUCOSE, CAPILLARY     Status: Abnormal   Collection Time    11/30/13  9:21 PM      Result Value Ref Range   Glucose-Capillary 195 (*) 70 - 99 mg/dL   Comment 1 Documented in Chart     Comment 2 Notify RN    COMPREHENSIVE METABOLIC PANEL     Status: Abnormal   Collection Time    12/01/13  5:03 AM      Result Value Ref Range   Sodium 142  137 - 147 mEq/L   Potassium 4.0  3.7 - 5.3 mEq/L   Chloride 101  96 - 112 mEq/L   CO2 29  19 - 32 mEq/L   Glucose, Bld 270 (*) 70 - 99 mg/dL   BUN 20  6 - 23 mg/dL   Creatinine, Ser 0.87  0.50 - 1.35 mg/dL   Calcium 9.5  8.4 - 10.5 mg/dL   Total Protein 6.8  6.0 - 8.3 g/dL   Albumin 3.5  3.5 - 5.2 g/dL   AST 18  0 - 37 U/L   ALT 16  0 - 53 U/L   Alkaline Phosphatase 107  39 - 117 U/L   Total Bilirubin 0.4  0.3 - 1.2 mg/dL   GFR calc non Af Amer 88 (*) >90 mL/min   GFR calc Af Amer >90  >90 mL/min   Comment: (NOTE)     The eGFR has been calculated using the CKD EPI equation.     This calculation has not been validated in all clinical situations.     eGFR's persistently <90 mL/min signify possible Chronic Kidney     Disease.   Anion gap 12  5 - 15  CBC     Status: Abnormal   Collection Time    12/01/13  5:03 AM      Result Value Ref Range   WBC 11.8 (*) 4.0 - 10.5 K/uL   RBC 4.75  4.22 - 5.81 MIL/uL   Hemoglobin 12.5 (*) 13.0 - 17.0 g/dL   HCT 36.9 (*) 39.0 - 52.0 %   MCV 77.7 (*) 78.0 - 100.0 fL   MCH 26.3  26.0 - 34.0 pg   MCHC 33.9  30.0 - 36.0 g/dL   RDW 14.6  11.5 - 15.5 %   Platelets 172  150 - 400 K/uL  GLUCOSE, CAPILLARY     Status: Abnormal   Collection Time    12/01/13  7:14 AM      Result Value Ref Range   Glucose-Capillary 300 (*) 70 - 99 mg/dL   Comment 1 Notify RN      ABGS No results found for this basename: PHART, PCO2, PO2ART, TCO2, HCO3,  in the last 72 hours CULTURES No results found for this or any previous visit (from the past 240 hour(s)). Studies/Results: Dg Chest  Portable 1 View  11/30/2013   CLINICAL DATA:  Shortness of breath. Cough. Diaphoretic for 1 week.  EXAM: PORTABLE CHEST - 1 VIEW  COMPARISON:  11/26/2013  FINDINGS: Shallow inspiration. The heart size and mediastinal contours are within normal limits. Both lungs are clear. The visualized skeletal structures are unremarkable.  IMPRESSION: No active disease.   Electronically Signed   By: Lucienne Capers M.D.   On: 11/30/2013 02:51    Medications: I have reviewed the patient's current medications.  Assesment: Active Problems:   Acute bronchitis with bronchospasm   Asthma exacerbation   Diabetes mellitus   HTN (hypertension)   HLD (hyperlipidemia)   Acute on chronic diastolic heart failure    Plan: Medications reviewed Will taper IV antibiotics Continue current treatment.    LOS: 1 day   Makhia Vosler 12/01/2013, 10:07 AM

## 2013-12-02 LAB — GLUCOSE, CAPILLARY
GLUCOSE-CAPILLARY: 220 mg/dL — AB (ref 70–99)
GLUCOSE-CAPILLARY: 265 mg/dL — AB (ref 70–99)
Glucose-Capillary: 321 mg/dL — ABNORMAL HIGH (ref 70–99)
Glucose-Capillary: 194 mg/dL — ABNORMAL HIGH (ref 70–99)

## 2013-12-02 MED ORDER — LEVOFLOXACIN 750 MG PO TABS
750.0000 mg | ORAL_TABLET | Freq: Every day | ORAL | Status: DC
  Administered 2013-12-03: 750 mg via ORAL
  Filled 2013-12-02: qty 1

## 2013-12-02 MED ORDER — METHYLPREDNISOLONE SODIUM SUCC 40 MG IJ SOLR
40.0000 mg | INTRAMUSCULAR | Status: DC
  Administered 2013-12-03: 40 mg via INTRAVENOUS
  Filled 2013-12-02: qty 1

## 2013-12-02 NOTE — Progress Notes (Signed)
Subjective: Patient is resting. He feels better. However, continued to have cough  With significant whitish sputum. No fever or chills.  Objective: Vital signs in last 24 hours: Temp:  [98 F (36.7 C)-98.1 F (36.7 C)] 98 F (36.7 C) (09/13 0453) Pulse Rate:  [64-85] 64 (09/13 0453) Resp:  [16-20] 20 (09/13 0453) BP: (130-147)/(66-85) 130/68 mmHg (09/13 0453) SpO2:  [90 %-97 %] 97 % (09/13 0453) Weight change:  Last BM Date: 11/28/13  Intake/Output from previous day: 09/12 0701 - 09/13 0700 In: 1170 [P.O.:720; IV Piggyback:450] Out: 1700 [Urine:1700]  PHYSICAL EXAM General appearance: alert and no distress Resp: diminished breath sounds bilaterally and rhonchi bilaterally Cardio: S1, S2 normal GI: soft, non-tender; bowel sounds normal; no masses,  no organomegaly Extremities: extremities normal, atraumatic, no cyanosis or edema  Lab Results:  Results for orders placed during the hospital encounter of 11/30/13 (from the past 48 hour(s))  GLUCOSE, CAPILLARY     Status: Abnormal   Collection Time    11/30/13  8:28 AM      Result Value Ref Range   Glucose-Capillary 369 (*) 70 - 99 mg/dL   Comment 1 Notify RN    GLUCOSE, CAPILLARY     Status: Abnormal   Collection Time    11/30/13 11:33 AM      Result Value Ref Range   Glucose-Capillary 377 (*) 70 - 99 mg/dL   Comment 1 Notify RN    GLUCOSE, CAPILLARY     Status: Abnormal   Collection Time    11/30/13  4:42 PM      Result Value Ref Range   Glucose-Capillary 340 (*) 70 - 99 mg/dL   Comment 1 Notify RN    GLUCOSE, CAPILLARY     Status: Abnormal   Collection Time    11/30/13  9:21 PM      Result Value Ref Range   Glucose-Capillary 195 (*) 70 - 99 mg/dL   Comment 1 Documented in Chart     Comment 2 Notify RN    COMPREHENSIVE METABOLIC PANEL     Status: Abnormal   Collection Time    12/01/13  5:03 AM      Result Value Ref Range   Sodium 142  137 - 147 mEq/L   Potassium 4.0  3.7 - 5.3 mEq/L   Chloride 101  96 - 112  mEq/L   CO2 29  19 - 32 mEq/L   Glucose, Bld 270 (*) 70 - 99 mg/dL   BUN 20  6 - 23 mg/dL   Creatinine, Ser 0.87  0.50 - 1.35 mg/dL   Calcium 9.5  8.4 - 10.5 mg/dL   Total Protein 6.8  6.0 - 8.3 g/dL   Albumin 3.5  3.5 - 5.2 g/dL   AST 18  0 - 37 U/L   ALT 16  0 - 53 U/L   Alkaline Phosphatase 107  39 - 117 U/L   Total Bilirubin 0.4  0.3 - 1.2 mg/dL   GFR calc non Af Amer 88 (*) >90 mL/min   GFR calc Af Amer >90  >90 mL/min   Comment: (NOTE)     The eGFR has been calculated using the CKD EPI equation.     This calculation has not been validated in all clinical situations.     eGFR's persistently <90 mL/min signify possible Chronic Kidney     Disease.   Anion gap 12  5 - 15  CBC     Status: Abnormal   Collection  Time    12/01/13  5:03 AM      Result Value Ref Range   WBC 11.8 (*) 4.0 - 10.5 K/uL   RBC 4.75  4.22 - 5.81 MIL/uL   Hemoglobin 12.5 (*) 13.0 - 17.0 g/dL   HCT 36.9 (*) 39.0 - 52.0 %   MCV 77.7 (*) 78.0 - 100.0 fL   MCH 26.3  26.0 - 34.0 pg   MCHC 33.9  30.0 - 36.0 g/dL   RDW 14.6  11.5 - 15.5 %   Platelets 172  150 - 400 K/uL  GLUCOSE, CAPILLARY     Status: Abnormal   Collection Time    12/01/13  7:14 AM      Result Value Ref Range   Glucose-Capillary 300 (*) 70 - 99 mg/dL   Comment 1 Notify RN    GLUCOSE, CAPILLARY     Status: Abnormal   Collection Time    12/01/13 11:32 AM      Result Value Ref Range   Glucose-Capillary 347 (*) 70 - 99 mg/dL   Comment 1 Notify RN    GLUCOSE, CAPILLARY     Status: Abnormal   Collection Time    12/01/13  4:47 PM      Result Value Ref Range   Glucose-Capillary 282 (*) 70 - 99 mg/dL   Comment 1 Notify RN    GLUCOSE, CAPILLARY     Status: Abnormal   Collection Time    12/01/13  8:24 PM      Result Value Ref Range   Glucose-Capillary 345 (*) 70 - 99 mg/dL   Comment 1 Notify RN     Comment 2 Documented in Chart    GLUCOSE, CAPILLARY     Status: Abnormal   Collection Time    12/02/13  7:17 AM      Result Value Ref Range    Glucose-Capillary 220 (*) 70 - 99 mg/dL   Comment 1 Notify RN      ABGS No results found for this basename: PHART, PCO2, PO2ART, TCO2, HCO3,  in the last 72 hours CULTURES No results found for this or any previous visit (from the past 240 hour(s)). Studies/Results: No results found.  Medications: I have reviewed the patient's current medications.  Assesment: Active Problems:   Acute bronchitis with bronchospasm   Asthma exacerbation   Diabetes mellitus   HTN (hypertension)   HLD (hyperlipidemia)   Acute on chronic diastolic heart failure    Plan: Medications reviewed Will taper IV steroid Continue IV antibiotics Continue current treatment.    LOS: 2 days   Tammala Weider 12/02/2013, 8:10 AM

## 2013-12-02 NOTE — Plan of Care (Signed)
Problem: Phase II Progression Outcomes Goal: Discharge plan remains appropriate-arrangements made Outcome: Progressing Expected discharge 9/13.  Problem: Discharge Progression Outcomes Goal: Flu vaccine received if indicated Outcome: Completed/Met Date Met:  12/02/13 Flu Vaccine given 12/01/13. Goal: Barriers To Progression Addressed/Resolved Outcome: Progressing Patient is tachycardic(140s-160s) when ambulating.

## 2013-12-02 NOTE — Progress Notes (Signed)
PHARMACIST - PHYSICIAN COMMUNICATION DR:   [place] CONCERNING: Antibiotic IV to Oral Route Change Policy  RECOMMENDATION: This patient is receiving Levaquin by the intravenous route.  Based on criteria approved by the Pharmacy and Therapeutics Committee, the antibiotic(s) is/are being converted to the equivalent oral dose form(s).  DESCRIPTION: These criteria include:  Patient being treated for a respiratory tract infection, urinary tract infection, cellulitis or clostridium difficile associated diarrhea if on metronidazole  The patient is not neutropenic and does not exhibit a GI malabsorption state  The patient is eating (either orally or via tube) and/or has been taking other orally administered medications for a least 24 hours  The patient is improving clinically and has a Tmax < 100.5  If you have questions about this conversion, please contact the Pharmacy Department  [x]  ( [phone] )  [facility] []  ( [phone]    []  ( [phone]  [facility] []  ( [phone] )  [facility]   S. Shirline Kendle, PharmD 

## 2013-12-02 NOTE — Progress Notes (Signed)
Patient had multiple episodes of SVT, while getting cleaned up this morning. Heart rate stayed above 140bpm for more than five minutes, and jumped to 160s at times. Tele strips saved. Patient complained of slight pain in chest--4/10 and sharp that comes and goes. Vitals signs were stable. Dr. Felecia Shelling notified and EKG ordered. EKG showed normal sinus rhythm with possible atrial enlargement, possible anterior infarct, and T wave abnormality. EKG placed in chart. Dr. Felecia Shelling notified of results. He recommended to have the patient rest and that the attending physician tomorrow morning will reevaluate. Will continue to monitor patient.

## 2013-12-03 LAB — GLUCOSE, CAPILLARY: GLUCOSE-CAPILLARY: 167 mg/dL — AB (ref 70–99)

## 2013-12-03 MED ORDER — LEVOFLOXACIN 750 MG PO TABS: 750.0000 mg | ORAL_TABLET | Freq: Every day | ORAL | Status: DC

## 2013-12-03 MED ORDER — PREDNISONE 20 MG PO TABS: ORAL_TABLET | ORAL | Status: DC

## 2013-12-03 NOTE — Discharge Summary (Signed)
Physician Discharge Summary  Patient ID: [name] MRN: [id number] DOB/AGE: [birth year] 67 y.o. Primary Care Physician:KNOWLTON,STEPHEN D, MD Admit date: 11/30/2013 Discharge date: 12/03/2013    Discharge Diagnoses:  1. Acute bronchitis with bronchospasm. Improved. 2. Hypertension. 3. Diabetes.     Medication List    STOP taking these medications       amoxicillin 500 MG capsule  Commonly known as:  AMOXIL      TAKE these medications       albuterol 108 (90 BASE) MCG/ACT inhaler  Commonly known as:  PROVENTIL HFA;VENTOLIN HFA  Inhale 2 puffs into the lungs every 6 (six) hours as needed. Shortness of breath     amLODipine 10 MG tablet  Commonly known as:  NORVASC  Take 10 mg by mouth daily.     aspirin EC 81 MG tablet  Take 81 mg by mouth daily.     atorvastatin 80 MG tablet  Commonly known as:  LIPITOR  Take 80 mg by mouth daily.     docusate sodium 100 MG capsule  Commonly known as:  COLACE  Take 1 capsule (100 mg total) by mouth every 12 (twelve) hours.     enalapril 20 MG tablet  Commonly known as:  VASOTEC  Take 20 mg by mouth daily.     furosemide 40 MG tablet  Commonly known as:  LASIX  Take 40 mg by mouth daily as needed for fluid.     LANTUS SOLOSTAR 100 UNIT/ML injection  Generic drug:  insulin glargine  Inject 14-16 Units into the skin at bedtime. 14 units in the morning and 16 units in the evening.     levofloxacin 750 MG tablet  Commonly known as:  LEVAQUIN  Take 1 tablet (750 mg total) by mouth daily.     metFORMIN 1000 MG tablet  Commonly known as:  GLUCOPHAGE  Take 1,000 mg by mouth 2 (two) times daily.     omeprazole 20 MG capsule  Commonly known as:  PRILOSEC  Take 20 mg by mouth daily.     potassium chloride 10 MEQ CR capsule  Commonly known as:  MICRO-K  Take 10 mEq by mouth daily.     predniSONE 20 MG tablet  Commonly known as:  DELTASONE  Take 2 tablets daily for 2 days then 1 tablet daily for 2 days then half tablet  daily for 2 days then stop.        Discharged Condition: Stable and improved.    Consults: None.  Significant Diagnostic Studies: Dg Chest 2 View  11/26/2013   CLINICAL DATA:  Upper respiratory infection with cough and congestion.  EXAM: CHEST - 2 VIEW  COMPARISON:  Chest x-ray from an abdominal series dated 04/02/2013  FINDINGS: Stable bibasilar scarring. There is no evidence of pulmonary edema, consolidation, pneumothorax, nodule or pleural fluid. Right-sided aortic arch again noted. The heart size is normal. The visualized skeletal structures are unremarkable.  IMPRESSION: No active disease.   Electronically Signed   By: Irish Lack M.D.   On: 11/26/2013 13:03   Dg Chest Portable 1 View  11/30/2013   CLINICAL DATA:  Shortness of breath. Cough. Diaphoretic for 1 week.  EXAM: PORTABLE CHEST - 1 VIEW  COMPARISON:  11/26/2013  FINDINGS: Shallow inspiration. The heart size and mediastinal contours are within normal limits. Both lungs are clear. The visualized skeletal structures are unremarkable.  IMPRESSION: No active disease.   Electronically Signed   By: Burman Nieves M.D.   On: 11/30/2013  02:51    Lab Results: Basic Metabolic Panel:  Recent Labs  13/08/[birth year] 0503  NA 142  K 4.0  CL 101  CO2 29  GLUCOSE 270*  BUN 20  CREATININE 0.87  CALCIUM 9.5   Liver Function Tests:  Recent Labs  12/01/13 0503  AST 18  ALT 16  ALKPHOS 107  BILITOT 0.4  PROT 6.8  ALBUMIN 3.5     CBC:  Recent Labs  12/01/13 0503  WBC 11.8*  HGB 12.5*  HCT 36.9*  MCV 77.7*  PLT 172    No results found for this or any previous visit (from the past 240 hour(s)).   Hospital Course: This 68 year old man presented to the hospital with symptoms of dyspnea and cough productive of white sputum. Please see initial history as outlined below: HPI: [name] is a 68 y.o. male has a past medical history significant for asthma, IDDM, HTN, presents to the ED with a chief complaint of shortness  of breath. He has been feeling poorly for the past week with increased cough with white sputum production, and was seen in the emergency room about 3 days ago with similar complaints, responded well to nebulizers at that time, felt better, and was discharged home on amoxicillin. Because of his history of diabetes mellitus, he did not get steroids. At home, over the last couple of days, he has been getting progressively short of breath, with significant wheezing and persistent cough so he see decided to come back. Patient is a rather poor historian, but denies any fever or chills, denies any chest pain, has no GI or GU complaints. In the emergency room, patient initially unable to speak in full sentences, responded well to continue his nebulizer treatments, more comfortable but still with significant wheezing, and tried hospitalist was asked for admission.  Patient denies history of COPD and states he has asthma, he is a rather poor historian and I see no pulmonary function tests in Epic. He also has a history of diabetes mellitus, and tells me he takes 2 "pills" per day as well as to "shots" per day. He unfortunately does not know how much insulin he takes, he tells me he draws in the syringe "about this much" showing a representation in air. He also tells me that he's been taking "a fluid pill" and he takes that every day, twice a day. The patient was treated with intravenous steroids and antibiotics. He improved slowly but appears to be back to his baseline. Chest x-ray did not show pneumonia. He has no clinical evidence of heart failure. He is now stable for discharge and will go on with short taper of oral steroids and a five-day course of antibiotics. He will followup in the office. Discharge Exam: Blood pressure 124/75, pulse 73, temperature 98.6 F (37 C), temperature source Oral, resp. rate 20, height 5\' 7"  (1.702 m), weight 89.812 kg (198 lb), SpO2 92.00%. He looks systemically well. There is no  increased work of breathing. There is no evidence of sepsis clinically. Lung fields are entirely clear with no evidence of wheezing, crackles or bronchial breathing. Heart sounds are present without gallop rhythm. There is no clinical evidence of heart failure. He is alert and oriented.  Disposition: Home.      Discharge Instructions   Diet - low sodium heart healthy    Complete by:  As directed      Increase activity slowly    Complete by:  As directed  SignedWilson Singer   12/03/2013, 8:28 AM

## 2013-12-03 NOTE — Progress Notes (Signed)
Patient given discharge instructions and wife at bedside.  Patients vitals stable and no complaints of pain.  Patient given education and discharge instructions.  Prescriptions given to patient and all belongings taken with patient at bedside. Dahlia Client (tech) with patient and patient discharged to home.

## 2013-12-04 NOTE — Care Management Note (Signed)
    Page 1 of 1   12/04/2013     5:25:59 PM willCARE MANAGEMENT NOTE 12/04/2013  Patient:  [name],[name]   Account Number:  [id number]  Date Initiated:  12/03/2013  Documentation initiated by:  Anibal Henderson  Subjective/Objective Assessment:   patient was admitted with acute bronchitis, , history CHF. Patient is from home lives with spouse, and plans to return home at discharge     Action/Plan:   no needs identified. Patient is independent   Anticipated DC Date:  12/03/2013   Anticipated DC Plan:        DC Planning Services  CM consult      Choice offered to / List presented to:             Status of service:  Completed, signed off Medicare Important Message given?  YES (If response is "NO", the following Medicare IM given date fields will be blank) Date Medicare IM given:  12/03/2013 Medicare IM given by:  Anibal Henderson Date Additional Medicare IM given:   Additional Medicare IM given by:    Discharge Disposition:  HOME/SELF CARE  Per UR Regulation:  Reviewed for med. necessity/level of care/duration of stay  If discussed at Long Length of Stay Meetings, dates discussed:    Comments:  12/03/13 1500 Anibal Henderson RN/CM

## 2014-03-18 IMAGING — CR DG ABDOMEN ACUTE W/ 1V CHEST
3 series · 3 of 3 positions shown · non-contrast
Comparison: Chest x-ray on 04/04/2011

CLINICAL DATA: Right lower quadrant abdominal pain.

EXAM:
ACUTE ABDOMEN SERIES (ABDOMEN 2 VIEW & CHEST 1 VIEW)

[view not recorded (1 of 3)]
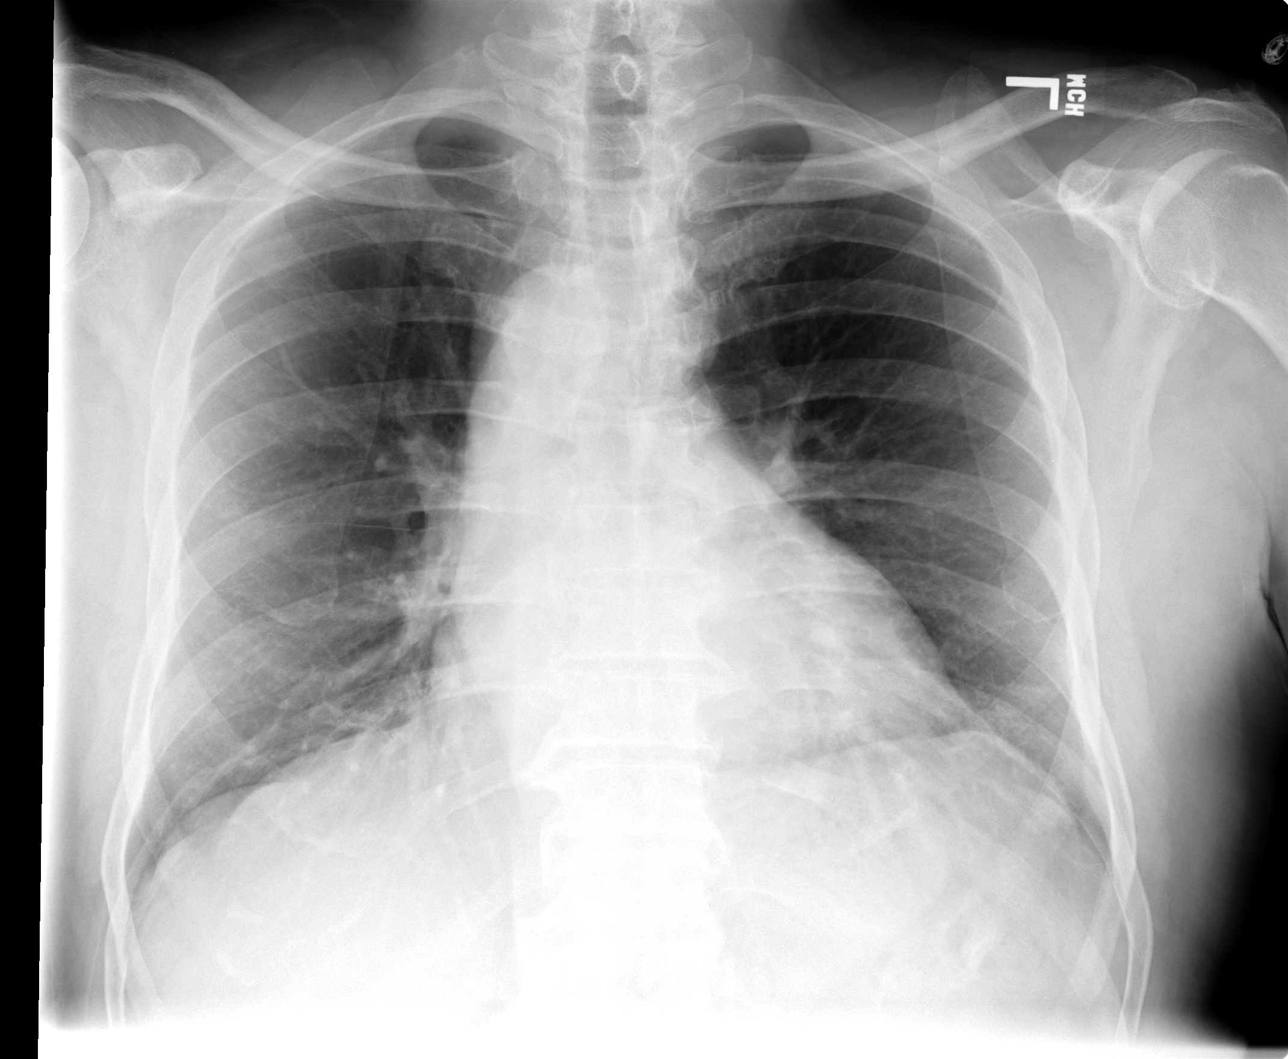

[view not recorded (2 of 3)]
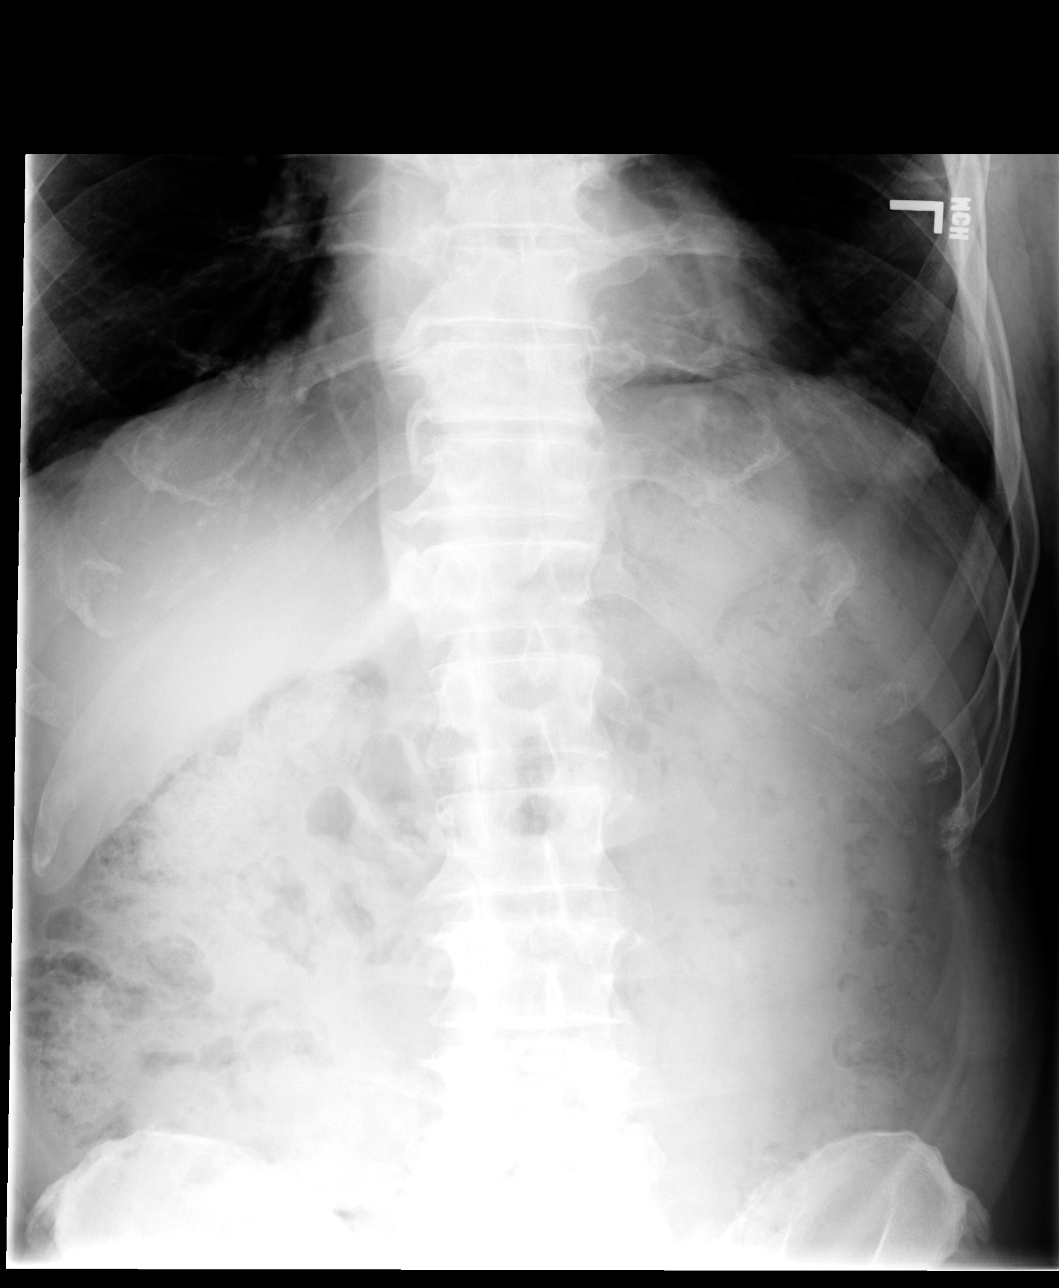

[view not recorded (3 of 3)]
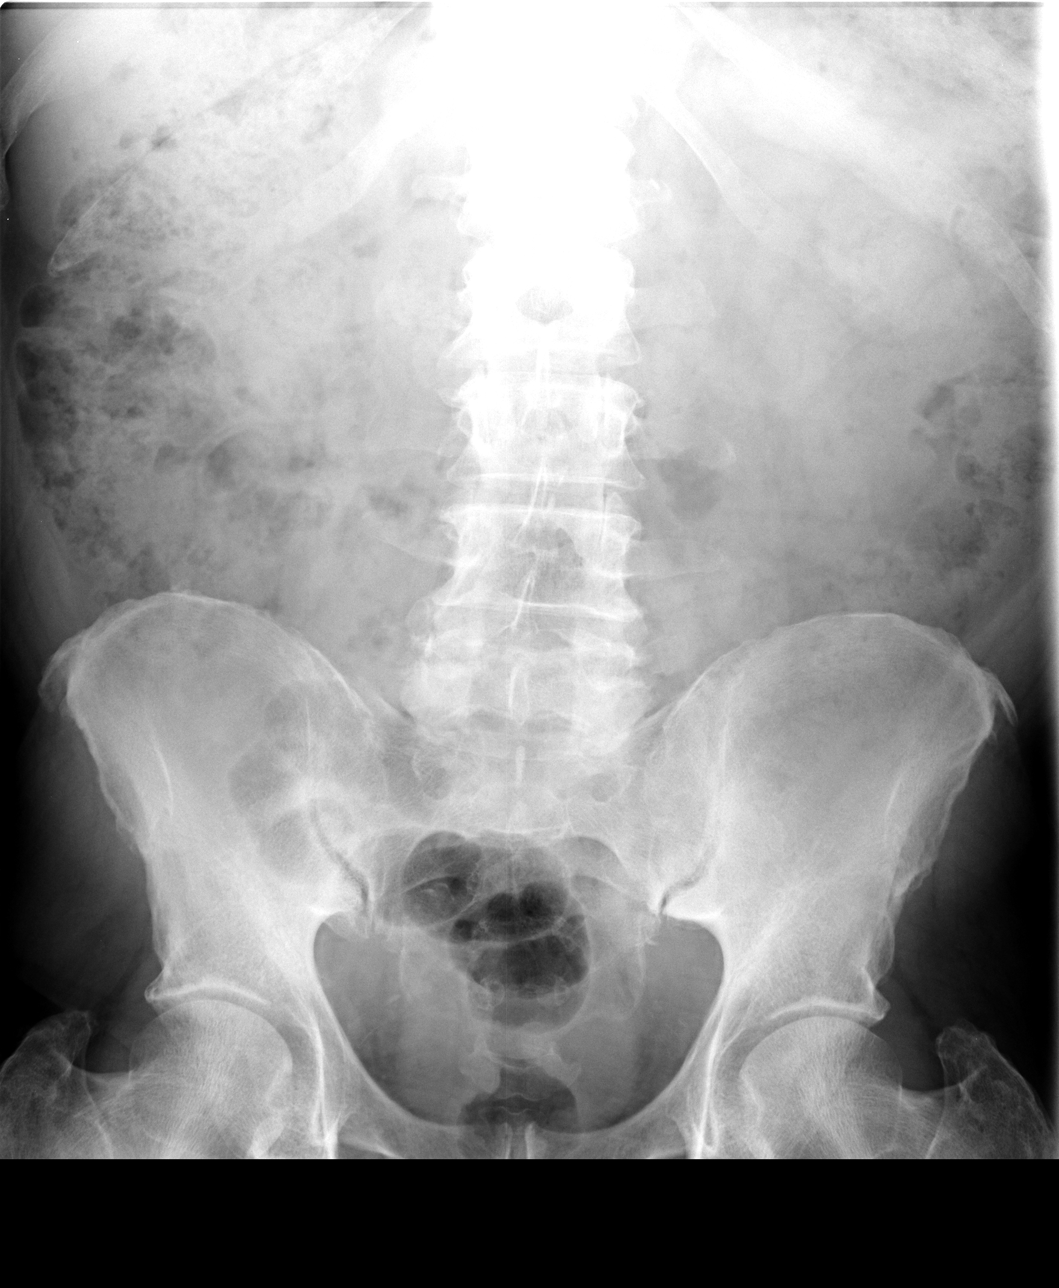

[3 of 3 positions shown; findings below may reference images not displayed]

FINDINGS: Chest x-ray shows bibasilar scarring/ atelectasis and stable
appearance of a right-sided aortic arch. Abdominal films show a
fairly large amount of stool throughout much of the colon. No focal
fecal impaction is identified. There is no evidence of small bowel
obstruction, significant ileus or free air. No abnormal
calcifications are seen. Degenerative changes are present in the
lower lumbar spine.
IMPRESSION: Moderate fecal material throughout the colon. No evidence of bowel
obstruction or significant ileus.

## 2015-06-20 ENCOUNTER — Encounter (HOSPITAL_COMMUNITY): Payer: Self-pay | Admitting: Emergency Medicine

## 2015-06-20 ENCOUNTER — Emergency Department (HOSPITAL_COMMUNITY): Payer: Medicare Other

## 2015-06-20 ENCOUNTER — Emergency Department (HOSPITAL_COMMUNITY)
Admission: EM | Admit: 2015-06-20 | Discharge: 2015-06-20 | Disposition: A | Discharge status: Home / Self Care | Payer: Medicare Other | Attending: Emergency Medicine | Admitting: Emergency Medicine

## 2015-06-20 DIAGNOSIS — K5900 Constipation, unspecified: Secondary | ICD-10-CM | POA: Insufficient documentation

## 2015-06-20 DIAGNOSIS — R1084 Generalized abdominal pain: Secondary | ICD-10-CM

## 2015-06-20 DIAGNOSIS — I10 Essential (primary) hypertension: Secondary | ICD-10-CM | POA: Insufficient documentation

## 2015-06-20 DIAGNOSIS — Z79899 Other long term (current) drug therapy: Secondary | ICD-10-CM | POA: Insufficient documentation

## 2015-06-20 DIAGNOSIS — E119 Type 2 diabetes mellitus without complications: Secondary | ICD-10-CM | POA: Diagnosis not present

## 2015-06-20 DIAGNOSIS — Z7982 Long term (current) use of aspirin: Secondary | ICD-10-CM | POA: Diagnosis not present

## 2015-06-20 DIAGNOSIS — Z7984 Long term (current) use of oral hypoglycemic drugs: Secondary | ICD-10-CM | POA: Diagnosis not present

## 2015-06-20 LAB — URINALYSIS, ROUTINE W REFLEX MICROSCOPIC
BILIRUBIN URINE: NEGATIVE
Glucose, UA: NEGATIVE mg/dL
Hgb urine dipstick: NEGATIVE
KETONES UR: 15 mg/dL — AB
LEUKOCYTES UA: NEGATIVE
NITRITE: NEGATIVE
PROTEIN: 30 mg/dL — AB
Specific Gravity, Urine: 1.005 (ref 1.005–1.030)
pH: 8.5 — ABNORMAL HIGH (ref 5.0–8.0)

## 2015-06-20 LAB — URINE MICROSCOPIC-ADD ON: Bacteria, UA: NONE SEEN

## 2015-06-20 LAB — COMPREHENSIVE METABOLIC PANEL
ALK PHOS: 97 U/L (ref 38–126)
ALT: 16 U/L — ABNORMAL LOW (ref 17–63)
ANION GAP: 9 (ref 5–15)
AST: 19 U/L (ref 15–41)
Albumin: 4.5 g/dL (ref 3.5–5.0)
BILIRUBIN TOTAL: 0.7 mg/dL (ref 0.3–1.2)
BUN: 15 mg/dL (ref 6–20)
CALCIUM: 9.4 mg/dL (ref 8.9–10.3)
CO2: 30 mmol/L (ref 22–32)
Chloride: 101 mmol/L (ref 101–111)
Creatinine, Ser: 0.86 mg/dL (ref 0.61–1.24)
GFR calc Af Amer: >60 mL/min (ref >60)
GLUCOSE: 229 mg/dL — AB (ref 65–99)
POTASSIUM: 5 mmol/L (ref 3.5–5.1)
Sodium: 140 mmol/L (ref 135–145)
TOTAL PROTEIN: 7.6 g/dL (ref 6.5–8.1)

## 2015-06-20 LAB — LIPASE, BLOOD: Lipase: 27 U/L (ref 11–51)

## 2015-06-20 LAB — CBC
HEMATOCRIT: 42.5 % (ref 39.0–52.0)
Hemoglobin: 14.5 g/dL (ref 13.0–17.0)
MCH: 26.7 pg (ref 26.0–34.0)
MCHC: 34.1 g/dL (ref 30.0–36.0)
MCV: 78.1 fL (ref 78.0–100.0)
Platelets: 110 10*3/uL — ABNORMAL LOW (ref 150–400)
RBC: 5.44 MIL/uL (ref 4.22–5.81)
RDW: 14.3 % (ref 11.5–15.5)
WBC: 8.7 10*3/uL (ref 4.0–10.5)

## 2015-06-20 MED ORDER — BISMUTH SUBSALICYLATE 262 MG/15ML PO SUSP
30.0000 mL | Freq: Once | ORAL | Status: AC
  Administered 2015-06-20: 30 mL via ORAL
  Filled 2015-06-20: qty 118

## 2015-06-20 MED ORDER — IOPAMIDOL (ISOVUE-300) INJECTION 61%
100.0000 mL | Freq: Once | INTRAVENOUS | Status: AC | PRN
  Administered 2015-06-20: 100 mL via INTRAVENOUS

## 2015-06-20 MED ORDER — ONDANSETRON 4 MG PO TBDP
4.0000 mg | ORAL_TABLET | Freq: Once | ORAL | Status: AC | PRN
  Administered 2015-06-20: 4 mg via ORAL
  Filled 2015-06-20: qty 1

## 2015-06-20 MED ORDER — MORPHINE SULFATE (PF) 4 MG/ML IV SOLN
4.0000 mg | Freq: Once | INTRAVENOUS | Status: AC
  Administered 2015-06-20: 4 mg via INTRAVENOUS
  Filled 2015-06-20: qty 1

## 2015-06-20 MED ORDER — BISMUTH SUBSALICYLATE 262 MG/15ML PO SUSP
ORAL | Status: AC
  Filled 2015-06-20: qty 236

## 2015-06-20 MED ORDER — TRAMADOL HCL 50 MG PO TABS: 50.0000 mg | ORAL_TABLET | Freq: Four times a day (QID) | ORAL | Status: DC | PRN

## 2015-06-20 MED ORDER — ONDANSETRON HCL 4 MG/2ML IJ SOLN
4.0000 mg | Freq: Once | INTRAMUSCULAR | Status: AC
  Administered 2015-06-20: 4 mg via INTRAVENOUS
  Filled 2015-06-20: qty 2

## 2015-06-20 NOTE — Discharge Instructions (Signed)
Follow up with your md Monday for recheck.  He may arrange for you to see a stomach specialist

## 2015-06-20 NOTE — ED Notes (Signed)
PT c/o middle abdominal pain with nausea and diarrhea x2 today. PT denies any urinary symptoms.

## 2015-06-20 NOTE — ED Provider Notes (Signed)
CSN: [id number]     Arrival date & time 06/20/15  1723 History   First MD Initiated Contact with Patient 06/20/15 2008     Chief Complaint  Patient presents with  . Abdominal Pain     (Consider location/radiation/quality/duration/timing/severity/associated sxs/prior Treatment) HPI   [name] is a 70 y.o. male who presents for evaluation of mid abdominal pain which started today. He reports having a bowel movement that was "long and hard", today. He also vomited twice at home, fluid that was clear. Otherwise, he was able to eat well today and yesterday. He denies fever, chills, weakness or dizziness. He has history of constipation. He denies other problems including cough or chest pain. There are no other known modifying factors.   Past Medical History  Diagnosis Date  . Diabetes mellitus   . Hypertension   . Bronchitis    Past Surgical History  Procedure Laterality Date  . Abdominal surgery     History reviewed. No pertinent family history. Social History  Substance Use Topics  . Smoking status: Never Smoker   . Smokeless tobacco: None  . Alcohol Use: No    Review of Systems  All other systems reviewed and are negative.     Allergies  Review of patient's allergies indicates no known allergies.  Home Medications   Prior to Admission medications   Medication Sig Start Date End Date Taking? Authorizing Provider  albuterol (PROVENTIL HFA;VENTOLIN HFA) 108 (90 BASE) MCG/ACT inhaler Inhale 2 puffs into the lungs every 4 (four) hours as needed for wheezing or shortness of breath. Shortness of breath   Yes Historical Provider, MD  amLODipine (NORVASC) 10 MG tablet Take 10 mg by mouth daily.   Yes Historical Provider, MD  aspirin EC 81 MG tablet Take 81 mg by mouth daily.   Yes Historical Provider, MD  atorvastatin (LIPITOR) 80 MG tablet Take 80 mg by mouth daily.   Yes Historical Provider, MD  docusate sodium (COLACE) 100 MG capsule Take 1 capsule (100 mg total) by  mouth every 12 (twelve) hours. 10/02/13  Yes Kristen N Ward, DO  enalapril (VASOTEC) 20 MG tablet Take 20 mg by mouth daily.   Yes Historical Provider, MD  furosemide (LASIX) 40 MG tablet Take 40 mg by mouth daily as needed for fluid.    Yes Historical Provider, MD  LANTUS SOLOSTAR 100 UNIT/ML Solostar Pen INJECT 14 UNITS INTO THE SKIN TWICE DAILY  FOR DIABETES 05/29/15  Yes Historical Provider, MD  metFORMIN (GLUCOPHAGE) 1000 MG tablet Take 1,000 mg by mouth 2 (two) times daily.   Yes Historical Provider, MD  omeprazole (PRILOSEC) 20 MG capsule Take 20 mg by mouth daily.   Yes Historical Provider, MD  potassium chloride (MICRO-K) 10 MEQ CR capsule Take 10 mEq by mouth daily.    Historical Provider, MD   BP 151/89 mmHg  Pulse 83  Temp(Src) 98.2 F (36.8 C) (Oral)  Resp 20  SpO2 100% Physical Exam  Constitutional: He is oriented to person, place, and time. He appears well-developed and well-nourished. He appears distressed (He is uncomfortable).  HENT:  Head: Normocephalic and atraumatic.  Right Ear: External ear normal.  Left Ear: External ear normal.  Eyes: Conjunctivae and EOM are normal. Pupils are equal, round, and reactive to light.  Neck: Normal range of motion and phonation normal. Neck supple.  Cardiovascular: Normal rate, regular rhythm and normal heart sounds.   Pulmonary/Chest: Effort normal and breath sounds normal. He exhibits no bony tenderness.  Abdominal: Soft.  He exhibits no distension and no mass. There is tenderness (Mild mid abdominal tenderness.). There is no rebound and no guarding.  Musculoskeletal: Normal range of motion. He exhibits no edema or tenderness.  Neurological: He is alert and oriented to person, place, and time. No cranial nerve deficit or sensory deficit. He exhibits normal muscle tone. Coordination normal.  Skin: Skin is warm, dry and intact.  Psychiatric: He has a normal mood and affect. His behavior is normal. Judgment and thought content normal.   Nursing note and vitals reviewed.   ED Course  Procedures (including critical care time)  Nonspecific abdominal pain with history constipation. Patient reports was eating well today, then developed pain, and some vomiting. He is nontoxic in appearance. He'll be evaluated for bowel obstruction, serious bacterial infection or metabolic instability.  Medications  ondansetron (ZOFRAN) injection 4 mg (not administered)  morphine 4 MG/ML injection 4 mg (not administered)  ondansetron (ZOFRAN-ODT) disintegrating tablet 4 mg (4 mg Oral Given 06/20/15 1744)  bismuth subsalicylate (PEPTO BISMOL) 262 MG/15ML suspension 30 mL (30 mLs Oral Given 06/20/15 2126)    Patient Vitals for the past 24 hrs:  BP Temp Temp src Pulse Resp SpO2  06/20/15 1739 151/89 mmHg 98.2 F (36.8 C) Oral 83 20 100 %    9:43 PM Reevaluation with update and discussion. After initial assessment and treatment, an updated evaluation reveals He continues to have moderate pain, and is uncomfortable. CT imaging ordered.Mancel Bale L    Labs Review Labs Reviewed  COMPREHENSIVE METABOLIC PANEL - Abnormal; Notable for the following:    Glucose, Bld 229 (*)    ALT 16 (*)    All other components within normal limits  CBC - Abnormal; Notable for the following:    Platelets 110 (*)    All other components within normal limits  LIPASE, BLOOD  URINALYSIS, ROUTINE W REFLEX MICROSCOPIC (NOT AT [facility])    Imaging Review Dg Abd Acute W/chest  06/20/2015  CLINICAL DATA:  Vomiting. Constipation. Abdominal pain for 1 day with abdominal distension. EXAM: DG ABDOMEN ACUTE W/ 1V CHEST COMPARISON:  11/30/2013. FINDINGS: Frontal view of the chest demonstrates Midline trachea. Normal heart size. Right-sided aortic arch. No pleural effusion or pneumothorax. Nodular density projecting over the left lung base. Right lung clear. Abdominal films demonstrate no free intraperitoneal air on upright positioning. Small bowel air-fluid levels within the  lower abdomen pelvis. Possible left nephrolithiasis. No significant gaseous distention of bowel loops on supine imaging. IMPRESSION: Nonspecific small bowel air-fluid levels, without bowel distension on supine imaging. This could represent gastroenteritis/adynamic ileus. Possible left nephrolithiasis. Suspicion of a left-sided nipple shadow. Repeat frontal radiograph with nipple markers could confirm. Electronically Signed   By: Jeronimo Greaves M.D.   On: 06/20/2015 21:11   I have personally reviewed and evaluated these images and lab results as part of my medical decision-making.   EKG Interpretation None      MDM   Final diagnoses:  Generalized abdominal pain  Constipation, unspecified constipation type    Abdominal pain with hard bowel movements, suggestive of constipation. However, patient  is at risk for other problems. There is an apparent left renal stone, but the pain does not seem like ureteral colic.  Nursing Notes Reviewed/ Care Coordinated, and agree without changes. Applicable Imaging Reviewed.  Interpretation of Laboratory Data incorporated into ED treatment  Plan- disposition after CT imaging, and a period of observation.    Mancel Bale, MD 06/23/15 [phone]

## 2015-06-20 NOTE — ED Provider Notes (Signed)
CT scan shows possible mild thickening around rectum. Patient was sent home with Ultram and he can see his family doctor on Monday  Bethann Berkshire, MD 06/20/15 2316

## 2015-06-20 NOTE — ED Notes (Signed)
Pt alert & oriented x4, stable gait. Patient given discharge instructions, paperwork & prescription(s). Patient  instructed to stop at the registration desk to finish any additional paperwork. Patient verbalized understanding. Pt left department w/ no further questions. 

## 2015-08-02 ENCOUNTER — Encounter (HOSPITAL_COMMUNITY): Payer: Self-pay | Admitting: Emergency Medicine

## 2015-08-02 ENCOUNTER — Emergency Department (HOSPITAL_COMMUNITY)
Admission: EM | Admit: 2015-08-02 | Discharge: 2015-08-02 | Disposition: A | Discharge status: Home / Self Care | Payer: Medicare Other | Attending: Emergency Medicine | Admitting: Emergency Medicine

## 2015-08-02 DIAGNOSIS — M545 Low back pain, unspecified: Secondary | ICD-10-CM

## 2015-08-02 DIAGNOSIS — Z79899 Other long term (current) drug therapy: Secondary | ICD-10-CM | POA: Diagnosis not present

## 2015-08-02 DIAGNOSIS — I10 Essential (primary) hypertension: Secondary | ICD-10-CM | POA: Diagnosis not present

## 2015-08-02 DIAGNOSIS — Z7984 Long term (current) use of oral hypoglycemic drugs: Secondary | ICD-10-CM | POA: Insufficient documentation

## 2015-08-02 DIAGNOSIS — E119 Type 2 diabetes mellitus without complications: Secondary | ICD-10-CM | POA: Diagnosis not present

## 2015-08-02 DIAGNOSIS — Z7982 Long term (current) use of aspirin: Secondary | ICD-10-CM | POA: Diagnosis not present

## 2015-08-02 DIAGNOSIS — Z794 Long term (current) use of insulin: Secondary | ICD-10-CM | POA: Diagnosis not present

## 2015-08-02 MED ORDER — IBUPROFEN 800 MG PO TABS: 800.0000 mg | ORAL_TABLET | Freq: Three times a day (TID) | ORAL | Status: DC

## 2015-08-02 MED ORDER — KETOROLAC TROMETHAMINE 60 MG/2ML IM SOLN
60.0000 mg | Freq: Once | INTRAMUSCULAR | Status: AC
  Administered 2015-08-02: 60 mg via INTRAMUSCULAR
  Filled 2015-08-02: qty 2

## 2015-08-02 MED ORDER — DEXAMETHASONE SODIUM PHOSPHATE 10 MG/ML IJ SOLN
10.0000 mg | Freq: Once | INTRAMUSCULAR | Status: AC
  Administered 2015-08-02: 10 mg via INTRAMUSCULAR
  Filled 2015-08-02: qty 1

## 2015-08-02 MED ORDER — METHOCARBAMOL 500 MG PO TABS: 500.0000 mg | ORAL_TABLET | Freq: Two times a day (BID) | ORAL | Status: DC | PRN

## 2015-08-02 NOTE — Discharge Instructions (Signed)

## 2015-08-02 NOTE — ED Notes (Signed)
Patient brought from low back pain without injury. Per patient has had similar back pain in past. Patient denies taking any medication for pain. Per patient incontinent of urine but states "That has been going on for a long time. I can't hold my pee." Denies any complications with BM.
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
## 2017-01-16 ENCOUNTER — Encounter (HOSPITAL_COMMUNITY): Payer: Self-pay | Admitting: Cardiology

## 2017-01-16 ENCOUNTER — Emergency Department (HOSPITAL_COMMUNITY)
Admission: EM | Admit: 2017-01-16 | Discharge: 2017-01-16 | Disposition: A | Discharge status: Home / Self Care | Payer: Medicare Other | Attending: Emergency Medicine | Admitting: Emergency Medicine

## 2017-01-16 ENCOUNTER — Emergency Department (HOSPITAL_COMMUNITY): Payer: Medicare Other

## 2017-01-16 DIAGNOSIS — J449 Chronic obstructive pulmonary disease, unspecified: Secondary | ICD-10-CM | POA: Insufficient documentation

## 2017-01-16 DIAGNOSIS — J45909 Unspecified asthma, uncomplicated: Secondary | ICD-10-CM | POA: Insufficient documentation

## 2017-01-16 DIAGNOSIS — R609 Edema, unspecified: Secondary | ICD-10-CM | POA: Insufficient documentation

## 2017-01-16 DIAGNOSIS — Z794 Long term (current) use of insulin: Secondary | ICD-10-CM | POA: Insufficient documentation

## 2017-01-16 DIAGNOSIS — Z79899 Other long term (current) drug therapy: Secondary | ICD-10-CM | POA: Diagnosis not present

## 2017-01-16 DIAGNOSIS — E119 Type 2 diabetes mellitus without complications: Secondary | ICD-10-CM | POA: Insufficient documentation

## 2017-01-16 DIAGNOSIS — Z7982 Long term (current) use of aspirin: Secondary | ICD-10-CM | POA: Insufficient documentation

## 2017-01-16 DIAGNOSIS — I110 Hypertensive heart disease with heart failure: Secondary | ICD-10-CM | POA: Diagnosis not present

## 2017-01-16 DIAGNOSIS — R079 Chest pain, unspecified: Secondary | ICD-10-CM

## 2017-01-16 DIAGNOSIS — R0789 Other chest pain: Secondary | ICD-10-CM | POA: Diagnosis present

## 2017-01-16 DIAGNOSIS — I5032 Chronic diastolic (congestive) heart failure: Secondary | ICD-10-CM | POA: Diagnosis not present

## 2017-01-16 HISTORY — DX: Gastro-esophageal reflux disease without esophagitis: K21.9

## 2017-01-16 HISTORY — DX: Rheumatoid arthritis, unspecified: M06.9

## 2017-01-16 HISTORY — DX: Patient's noncompliance with other medical treatment and regimen due to unspecified reason: Z91.199

## 2017-01-16 HISTORY — DX: Patient's noncompliance with other medical treatment and regimen: Z91.19

## 2017-01-16 LAB — BASIC METABOLIC PANEL
Anion gap: 10 (ref 5–15)
BUN: 17 mg/dL (ref 6–20)
CO2: 30 mmol/L (ref 22–32)
Calcium: 10.2 mg/dL (ref 8.9–10.3)
Chloride: 101 mmol/L (ref 101–111)
Creatinine, Ser: 1.08 mg/dL (ref 0.61–1.24)
GFR calc Af Amer: >60 mL/min (ref >60)
GFR calc non Af Amer: >60 mL/min (ref >60)
Glucose, Bld: 370 mg/dL — ABNORMAL HIGH (ref 65–99)
Potassium: 4.7 mmol/L (ref 3.5–5.1)
Sodium: 141 mmol/L (ref 135–145)

## 2017-01-16 LAB — CBC WITH DIFFERENTIAL/PLATELET
Basophils Absolute: 0 10*3/uL (ref 0.0–0.1)
Basophils Relative: 1 %
Eosinophils Absolute: 0.2 10*3/uL (ref 0.0–0.7)
Eosinophils Relative: 3 %
HCT: 41.9 % (ref 39.0–52.0)
Hemoglobin: 14.3 g/dL (ref 13.0–17.0)
Lymphocytes Relative: 33 %
Lymphs Abs: 1.8 10*3/uL (ref 0.7–4.0)
MCH: 26.2 pg (ref 26.0–34.0)
MCHC: 34.1 g/dL (ref 30.0–36.0)
MCV: 76.7 fL — ABNORMAL LOW (ref 78.0–100.0)
Monocytes Absolute: 0.3 10*3/uL (ref 0.1–1.0)
Monocytes Relative: 6 %
Neutro Abs: 3.2 10*3/uL (ref 1.7–7.7)
Neutrophils Relative %: 58 %
Platelets: 173 10*3/uL (ref 150–400)
RBC: 5.46 MIL/uL (ref 4.22–5.81)
RDW: 14.4 % (ref 11.5–15.5)
WBC: 5.5 10*3/uL (ref 4.0–10.5)

## 2017-01-16 LAB — TROPONIN I
Troponin I: <0.03 ng/mL (ref <0.03)
Troponin I: <0.03 ng/mL (ref <0.03)

## 2017-01-16 MED ORDER — HYDROCODONE-ACETAMINOPHEN 5-325 MG PO TABS
1.0000 | ORAL_TABLET | Freq: Once | ORAL | Status: AC
  Administered 2017-01-16: 1 via ORAL
  Filled 2017-01-16: qty 1

## 2017-01-16 MED ORDER — IBUPROFEN 400 MG PO TABS
600.0000 mg | ORAL_TABLET | Freq: Once | ORAL | Status: AC
  Administered 2017-01-16: 600 mg via ORAL
  Filled 2017-01-16: qty 2

## 2017-01-16 NOTE — ED Provider Notes (Signed)
[facility] EMERGENCY DEPARTMENT Provider Note   CSN: [id number] Arrival date & time: 01/16/17  1535     History   Chief Complaint Chief Complaint  Patient presents with  . Chest Pain    HPI [name] is a 71 y.o. male.  HPI   71yM with chest pain.  Onset about an hour prior to arrival.  He was sitting down resting when symptoms began.  Says he feels tight in the center of his chest.  Does not radiate.  Symptoms have been constant since onset.  No appreciable exacerbating relieving factors.  This pain does not radiate.  Denies any other associated symptoms such as dyspnea, diaphoresis or nausea.  No unusual leg pain or swelling.  Has not tried taking anything for his pain.  Past Medical History:  Diagnosis Date  . Bronchitis   . Diabetes mellitus   . GERD (gastroesophageal reflux disease)   . Hypertension   . Non-compliance   . RA (rheumatoid arthritis) [facility])     Patient Active Problem List   Diagnosis Date Noted  . Acute bronchitis with bronchospasm 11/30/2013  . COPD exacerbation (HCC) 11/30/2013  . Asthma exacerbation 11/30/2013  . Diabetes mellitus (HCC) 11/30/2013  . HTN (hypertension) 11/30/2013  . HLD (hyperlipidemia) 11/30/2013  . Acute on chronic diastolic heart failure (HCC) 11/30/2013    Past Surgical History:  Procedure Laterality Date  . ABDOMINAL SURGERY         Home Medications    Prior to Admission medications   Medication Sig Start Date End Date Taking? Authorizing Provider  albuterol (PROVENTIL HFA;VENTOLIN HFA) 108 (90 BASE) MCG/ACT inhaler Inhale 2 puffs into the lungs every 4 (four) hours as needed for wheezing or shortness of breath. Shortness of breath    [provider]  amLODipine (NORVASC) 10 MG tablet Take 10 mg by mouth daily.    [provider]  aspirin EC 81 MG tablet Take 81 mg by mouth daily.    [provider]  atorvastatin (LIPITOR) 80 MG tablet Take 80 mg by mouth daily.    [provider]  docusate sodium (COLACE) 100 MG capsule Take 1 capsule (100 mg total) by mouth every 12 (twelve) hours. 10/02/13   Ward, Layla Maw, DO  enalapril (VASOTEC) 20 MG tablet Take 20 mg by mouth daily.    [provider]  furosemide (LASIX) 40 MG tablet Take 40 mg by mouth daily as needed for fluid.     [provider]  ibuprofen (ADVIL,MOTRIN) 800 MG tablet Take 1 tablet (800 mg total) by mouth 3 (three) times daily. 08/02/15   Eber Hong, MD  LANTUS SOLOSTAR 100 UNIT/ML Solostar Pen INJECT 14 UNITS INTO THE SKIN TWICE DAILY  FOR DIABETES 05/29/15   [provider]  metFORMIN (GLUCOPHAGE) 1000 MG tablet Take 1,000 mg by mouth 2 (two) times daily.    [provider]  methocarbamol (ROBAXIN) 500 MG tablet Take 1 tablet (500 mg total) by mouth 2 (two) times daily as needed for muscle spasms. 08/02/15   Eber Hong, MD  omeprazole (PRILOSEC) 20 MG capsule Take 20 mg by mouth daily.    [provider]  potassium chloride (MICRO-K) 10 MEQ CR capsule Take 10 mEq by mouth daily.    [provider]  traMADol (ULTRAM) 50 MG tablet Take 1 tablet (50 mg total) by mouth every 6 (six) hours as needed. Patient not taking: Reported on 08/02/2015 06/20/15   Bethann Berkshire, MD  Family History History reviewed. No pertinent family history.  Social History Social History  Substance Use Topics  . Smoking status: Never Smoker  . Smokeless tobacco: Never Used  . Alcohol use No     Allergies   Patient has no known allergies.   Review of Systems Review of Systems  All systems reviewed and negative, other than as noted in HPI.  Physical Exam Updated Vital Signs BP (!) 166/105 (BP Location: Left Arm)   Pulse 82   Temp 97.9 F (36.6 C) (Oral)   Resp 16   Ht 5\' 10"  (1.778 m)   Wt 89.8 kg (198 lb)   SpO2 94%   BMI 28.41 kg/m   Physical Exam  Constitutional: He appears well-developed and well-nourished. No distress.  HENT:  Head:  Normocephalic and atraumatic.  Eyes: Conjunctivae are normal. Right eye exhibits no discharge. Left eye exhibits no discharge.  Neck: Neck supple.  Cardiovascular: Normal rate, regular rhythm and normal heart sounds.  Exam reveals no gallop and no friction rub.   No murmur heard. Pulmonary/Chest: Effort normal and breath sounds normal. No respiratory distress.  Abdominal: Soft. He exhibits no distension. There is no tenderness.  Musculoskeletal: He exhibits edema. He exhibits no tenderness.  Very mild symmetric LE edema. No calf tenderness.   Neurological: He is alert.  Skin: Skin is warm and dry.  Psychiatric: He has a normal mood and affect. His behavior is normal. Thought content normal.  Nursing note and vitals reviewed.    ED Treatments / Results  Labs (all labs ordered are listed, but only abnormal results are displayed) Labs Reviewed  CBC WITH DIFFERENTIAL/PLATELET - Abnormal; Notable for the following components:      Result Value   MCV 76.7 (*)    All other components within normal limits  BASIC METABOLIC PANEL - Abnormal; Notable for the following components:   Glucose, Bld 370 (*)    All other components within normal limits  TROPONIN I  TROPONIN I    EKG  EKG Interpretation  Date/Time:  Sunday January 16 2017 15:45:23 EDT Ventricular Rate:  82 PR Interval:    QRS Duration: 100 QT Interval:  378 QTC Calculation: 442 R Axis:   39 Text Interpretation:  Sinus rhythm Probable left atrial enlargement Anteroseptal infarct, old Borderline T abnormalities, inferior leads Confirmed by Shernell Saldierna ([id number]) on 01/16/2017 3:55:22 PM       Radiology No results found.  Procedures Procedures (including critical care time)  Medications Ordered in ED Medications  ibuprofen (ADVIL,MOTRIN) tablet 600 mg (not administered)  HYDROcodone-acetaminophen (NORCO/VICODIN) 5-325 MG per tablet 1 tablet (not administered)     Initial Impression / Assessment and Plan / ED  Course  I have reviewed the triage vital signs and the nursing notes.  Pertinent labs & imaging results that were available during my care of the patient were reviewed by me and considered in my medical decision making (see chart for details).     71  year old male with chest pain.  Doubt ACS, PE, dissection of the emergent process. Final Clinical Impressions(s) / ED Diagnoses   Final diagnoses:  Chest pain, unspecified type    New Prescriptions New Prescriptions   No medications on file     , MD 02/01/17 1008

## 2017-01-16 NOTE — ED Notes (Signed)
Call [name]  [phone] when pt is discharged for ride,.

## 2017-01-16 NOTE — ED Triage Notes (Signed)
Chest pain and SOB times 1 hour

## 2017-04-27 ENCOUNTER — Encounter (HOSPITAL_COMMUNITY): Payer: Self-pay

## 2017-04-27 ENCOUNTER — Inpatient Hospital Stay (HOSPITAL_COMMUNITY)
Admission: EM | Admit: 2017-04-27 | Discharge: 2017-04-28 | DRG: 683 | Disposition: A | Discharge status: Home / Self Care | Payer: Medicare Other | Attending: Internal Medicine | Admitting: Internal Medicine

## 2017-04-27 DIAGNOSIS — I9589 Other hypotension: Secondary | ICD-10-CM | POA: Diagnosis not present

## 2017-04-27 DIAGNOSIS — M069 Rheumatoid arthritis, unspecified: Secondary | ICD-10-CM | POA: Diagnosis not present

## 2017-04-27 DIAGNOSIS — E861 Hypovolemia: Secondary | ICD-10-CM | POA: Diagnosis not present

## 2017-04-27 DIAGNOSIS — Z7982 Long term (current) use of aspirin: Secondary | ICD-10-CM

## 2017-04-27 DIAGNOSIS — E119 Type 2 diabetes mellitus without complications: Secondary | ICD-10-CM

## 2017-04-27 DIAGNOSIS — J449 Chronic obstructive pulmonary disease, unspecified: Secondary | ICD-10-CM | POA: Diagnosis present

## 2017-04-27 DIAGNOSIS — N179 Acute kidney failure, unspecified: Principal | ICD-10-CM | POA: Diagnosis present

## 2017-04-27 DIAGNOSIS — Z794 Long term (current) use of insulin: Secondary | ICD-10-CM | POA: Diagnosis not present

## 2017-04-27 DIAGNOSIS — I5032 Chronic diastolic (congestive) heart failure: Secondary | ICD-10-CM | POA: Diagnosis present

## 2017-04-27 DIAGNOSIS — R109 Unspecified abdominal pain: Secondary | ICD-10-CM | POA: Diagnosis present

## 2017-04-27 DIAGNOSIS — I10 Essential (primary) hypertension: Secondary | ICD-10-CM | POA: Diagnosis present

## 2017-04-27 DIAGNOSIS — K219 Gastro-esophageal reflux disease without esophagitis: Secondary | ICD-10-CM | POA: Diagnosis not present

## 2017-04-27 DIAGNOSIS — E1165 Type 2 diabetes mellitus with hyperglycemia: Secondary | ICD-10-CM | POA: Diagnosis present

## 2017-04-27 DIAGNOSIS — Z9119 Patient's noncompliance with other medical treatment and regimen: Secondary | ICD-10-CM

## 2017-04-27 DIAGNOSIS — Z79899 Other long term (current) drug therapy: Secondary | ICD-10-CM

## 2017-04-27 DIAGNOSIS — E860 Dehydration: Secondary | ICD-10-CM | POA: Diagnosis present

## 2017-04-27 DIAGNOSIS — E785 Hyperlipidemia, unspecified: Secondary | ICD-10-CM | POA: Diagnosis not present

## 2017-04-27 DIAGNOSIS — I959 Hypotension, unspecified: Secondary | ICD-10-CM | POA: Diagnosis present

## 2017-04-27 DIAGNOSIS — I110 Hypertensive heart disease with heart failure: Secondary | ICD-10-CM | POA: Diagnosis present

## 2017-04-27 LAB — I-STAT TROPONIN, ED: TROPONIN I, POC: 0.01 ng/mL (ref 0.00–0.08)

## 2017-04-27 LAB — I-STAT CHEM 8, ED
BUN: 22 mg/dL — AB (ref 6–20)
CALCIUM ION: 1.12 mmol/L — AB (ref 1.15–1.40)
Chloride: 96 mmol/L — ABNORMAL LOW (ref 101–111)
Creatinine, Ser: 1.9 mg/dL — ABNORMAL HIGH (ref 0.61–1.24)
GLUCOSE: 82 mg/dL (ref 65–99)
HCT: 39 % (ref 39.0–52.0)
Hemoglobin: 13.3 g/dL (ref 13.0–17.0)
POTASSIUM: 3.3 mmol/L — AB (ref 3.5–5.1)
SODIUM: 136 mmol/L (ref 135–145)
TCO2: 25 mmol/L (ref 22–32)

## 2017-04-27 LAB — CBG MONITORING, ED: GLUCOSE-CAPILLARY: 79 mg/dL (ref 65–99)

## 2017-04-27 NOTE — ED Provider Notes (Addendum)
Patient presented to the ER with abdominal pain.  Patient reports that it started earlier today.  He has had nausea and vomiting but denies diarrhea.  Patient comes by ambulance.  Patient reportedly hypotensive initially, improved with IV fluids.  He has been having vomiting associated with his abdominal pain.  Face to face Exam: HEENT - PERRLA Lungs - CTAB Heart - RRR, no M/R/G Abd -soft, nondistended.  Tender suprapubic and left lower quadrant Neuro - alert, oriented x3  Plan: Patient hypotensive at arrival.  This improved with IV fluids.  He is complaining of abdominal discomfort.  Examination revealed tenderness without guarding or rebound.  CT abdomen and pelvis does not show any acute pathology.  Blood work was otherwise unremarkable except for significantly elevated BUN and creatinine.  This is likely secondary to dehydration.  Blood pressure improved with IV fluids, but patient requiring ongoing fluids to maintain blood pressure.  Will admit to hospital for further treatment and evaluation of acute kidney injury.   Gilda Crease, MD 04/27/17 2358    Gilda Crease, MD 04/28/17 [phone]

## 2017-04-27 NOTE — ED Triage Notes (Signed)
Pt in by [facility] for altered mental status and low blood pressure.  EMS reports bp 62/40 initially, now with iv fluids 70/51.   Pt has also been vomiting and having abd pain

## 2017-04-28 ENCOUNTER — Emergency Department (HOSPITAL_COMMUNITY): Payer: Medicare Other

## 2017-04-28 ENCOUNTER — Encounter (HOSPITAL_COMMUNITY): Payer: Self-pay | Admitting: *Deleted

## 2017-04-28 ENCOUNTER — Other Ambulatory Visit: Payer: Self-pay

## 2017-04-28 DIAGNOSIS — E118 Type 2 diabetes mellitus with unspecified complications: Secondary | ICD-10-CM

## 2017-04-28 DIAGNOSIS — N179 Acute kidney failure, unspecified: Principal | ICD-10-CM

## 2017-04-28 DIAGNOSIS — I110 Hypertensive heart disease with heart failure: Secondary | ICD-10-CM | POA: Diagnosis not present

## 2017-04-28 DIAGNOSIS — Z7982 Long term (current) use of aspirin: Secondary | ICD-10-CM | POA: Diagnosis not present

## 2017-04-28 DIAGNOSIS — E785 Hyperlipidemia, unspecified: Secondary | ICD-10-CM | POA: Diagnosis not present

## 2017-04-28 DIAGNOSIS — E1165 Type 2 diabetes mellitus with hyperglycemia: Secondary | ICD-10-CM | POA: Diagnosis not present

## 2017-04-28 DIAGNOSIS — M069 Rheumatoid arthritis, unspecified: Secondary | ICD-10-CM | POA: Diagnosis not present

## 2017-04-28 DIAGNOSIS — Z9119 Patient's noncompliance with other medical treatment and regimen: Secondary | ICD-10-CM | POA: Diagnosis not present

## 2017-04-28 DIAGNOSIS — E861 Hypovolemia: Secondary | ICD-10-CM

## 2017-04-28 DIAGNOSIS — I10 Essential (primary) hypertension: Secondary | ICD-10-CM

## 2017-04-28 DIAGNOSIS — I9589 Other hypotension: Secondary | ICD-10-CM | POA: Diagnosis not present

## 2017-04-28 DIAGNOSIS — K219 Gastro-esophageal reflux disease without esophagitis: Secondary | ICD-10-CM | POA: Diagnosis not present

## 2017-04-28 DIAGNOSIS — R109 Unspecified abdominal pain: Secondary | ICD-10-CM | POA: Diagnosis present

## 2017-04-28 DIAGNOSIS — Z794 Long term (current) use of insulin: Secondary | ICD-10-CM

## 2017-04-28 DIAGNOSIS — I5032 Chronic diastolic (congestive) heart failure: Secondary | ICD-10-CM | POA: Diagnosis not present

## 2017-04-28 DIAGNOSIS — E860 Dehydration: Secondary | ICD-10-CM

## 2017-04-28 DIAGNOSIS — Z79899 Other long term (current) drug therapy: Secondary | ICD-10-CM | POA: Diagnosis not present

## 2017-04-28 DIAGNOSIS — I959 Hypotension, unspecified: Secondary | ICD-10-CM | POA: Diagnosis present

## 2017-04-28 DIAGNOSIS — J449 Chronic obstructive pulmonary disease, unspecified: Secondary | ICD-10-CM | POA: Diagnosis not present

## 2017-04-28 LAB — GLUCOSE, CAPILLARY
GLUCOSE-CAPILLARY: 333 mg/dL — AB (ref 65–99)
GLUCOSE-CAPILLARY: 347 mg/dL — AB (ref 65–99)
Glucose-Capillary: 230 mg/dL — ABNORMAL HIGH (ref 65–99)

## 2017-04-28 LAB — COMPREHENSIVE METABOLIC PANEL
ALT: 18 U/L (ref 17–63)
ANION GAP: 13 (ref 5–15)
AST: 20 U/L (ref 15–41)
Albumin: 3.5 g/dL (ref 3.5–5.0)
Alkaline Phosphatase: 84 U/L (ref 38–126)
BILIRUBIN TOTAL: 0.7 mg/dL (ref 0.3–1.2)
BUN: 24 mg/dL — ABNORMAL HIGH (ref 6–20)
CHLORIDE: 98 mmol/L — AB (ref 101–111)
CO2: 25 mmol/L (ref 22–32)
Calcium: 8.9 mg/dL (ref 8.9–10.3)
Creatinine, Ser: 1.89 mg/dL — ABNORMAL HIGH (ref 0.61–1.24)
GFR calc Af Amer: 40 mL/min — ABNORMAL LOW (ref >60)
GFR calc non Af Amer: 35 mL/min — ABNORMAL LOW (ref >60)
GLUCOSE: 85 mg/dL (ref 65–99)
POTASSIUM: 3.3 mmol/L — AB (ref 3.5–5.1)
SODIUM: 136 mmol/L (ref 135–145)
TOTAL PROTEIN: 6.2 g/dL — AB (ref 6.5–8.1)

## 2017-04-28 LAB — CBC
HEMATOCRIT: 36.9 % — AB (ref 39.0–52.0)
HEMOGLOBIN: 12.5 g/dL — AB (ref 13.0–17.0)
MCH: 25.6 pg — ABNORMAL LOW (ref 26.0–34.0)
MCHC: 33.9 g/dL (ref 30.0–36.0)
MCV: 75.5 fL — ABNORMAL LOW (ref 78.0–100.0)
Platelets: 181 10*3/uL (ref 150–400)
RBC: 4.89 MIL/uL (ref 4.22–5.81)
RDW: 13.6 % (ref 11.5–15.5)
WBC: 9 10*3/uL (ref 4.0–10.5)

## 2017-04-28 LAB — I-STAT TROPONIN, ED
TROPONIN I, POC: 0 ng/mL (ref 0.00–0.08)
Troponin i, poc: 0.01 ng/mL (ref 0.00–0.08)

## 2017-04-28 LAB — BASIC METABOLIC PANEL
ANION GAP: 12 (ref 5–15)
BUN: 22 mg/dL — ABNORMAL HIGH (ref 6–20)
CHLORIDE: 96 mmol/L — AB (ref 101–111)
CO2: 24 mmol/L (ref 22–32)
Calcium: 8.5 mg/dL — ABNORMAL LOW (ref 8.9–10.3)
Creatinine, Ser: 1.35 mg/dL — ABNORMAL HIGH (ref 0.61–1.24)
GFR calc non Af Amer: 52 mL/min — ABNORMAL LOW (ref >60)
Glucose, Bld: 283 mg/dL — ABNORMAL HIGH (ref 65–99)
POTASSIUM: 4.1 mmol/L (ref 3.5–5.1)
SODIUM: 132 mmol/L — AB (ref 135–145)

## 2017-04-28 LAB — CBC WITH DIFFERENTIAL/PLATELET
BASOS ABS: 0 10*3/uL (ref 0.0–0.1)
Basophils Relative: 0 %
EOS ABS: 0 10*3/uL (ref 0.0–0.7)
Eosinophils Relative: 1 %
HCT: 37.9 % — ABNORMAL LOW (ref 39.0–52.0)
Hemoglobin: 13 g/dL (ref 13.0–17.0)
Lymphocytes Relative: 25 %
Lymphs Abs: 2.1 10*3/uL (ref 0.7–4.0)
MCH: 25.7 pg — ABNORMAL LOW (ref 26.0–34.0)
MCHC: 34.3 g/dL (ref 30.0–36.0)
MCV: 75 fL — ABNORMAL LOW (ref 78.0–100.0)
MONO ABS: 0.5 10*3/uL (ref 0.1–1.0)
MONOS PCT: 7 %
Neutro Abs: 5.6 10*3/uL (ref 1.7–7.7)
Neutrophils Relative %: 67 %
PLATELETS: 189 10*3/uL (ref 150–400)
RBC: 5.05 MIL/uL (ref 4.22–5.81)
RDW: 13.6 % (ref 11.5–15.5)
WBC: 8.4 10*3/uL (ref 4.0–10.5)

## 2017-04-28 LAB — I-STAT CG4 LACTIC ACID, ED: LACTIC ACID, VENOUS: 1.51 mmol/L (ref 0.5–1.9)

## 2017-04-28 LAB — URINALYSIS, ROUTINE W REFLEX MICROSCOPIC
Bilirubin Urine: NEGATIVE
GLUCOSE, UA: 150 mg/dL — AB
Hgb urine dipstick: NEGATIVE
Ketones, ur: NEGATIVE mg/dL
LEUKOCYTES UA: NEGATIVE
Nitrite: NEGATIVE
PROTEIN: NEGATIVE mg/dL
SPECIFIC GRAVITY, URINE: 1.011 (ref 1.005–1.030)
pH: 5 (ref 5.0–8.0)

## 2017-04-28 LAB — HEMOGLOBIN A1C
HEMOGLOBIN A1C: 14.1 % — AB (ref 4.8–5.6)
MEAN PLASMA GLUCOSE: 357.97 mg/dL

## 2017-04-28 LAB — MRSA PCR SCREENING: MRSA BY PCR: NEGATIVE

## 2017-04-28 MED ORDER — ONDANSETRON HCL 4 MG PO TABS: 4.0000 mg | ORAL_TABLET | Freq: Four times a day (QID) | ORAL | Status: DC | PRN

## 2017-04-28 MED ORDER — GI COCKTAIL ~~LOC~~: 30.0000 mL | Freq: Three times a day (TID) | ORAL | Status: DC | PRN

## 2017-04-28 MED ORDER — ONDANSETRON HCL 4 MG/2ML IJ SOLN: 4.0000 mg | Freq: Four times a day (QID) | INTRAMUSCULAR | Status: DC | PRN

## 2017-04-28 MED ORDER — ENOXAPARIN SODIUM 40 MG/0.4ML ~~LOC~~ SOLN
40.0000 mg | SUBCUTANEOUS | Status: DC
  Administered 2017-04-28: 40 mg via SUBCUTANEOUS
  Filled 2017-04-28: qty 0.4

## 2017-04-28 MED ORDER — ACETAMINOPHEN 650 MG RE SUPP: 650.0000 mg | Freq: Four times a day (QID) | RECTAL | Status: DC | PRN

## 2017-04-28 MED ORDER — HYDROMORPHONE HCL 1 MG/ML IJ SOLN
0.5000 mg | INTRAMUSCULAR | Status: DC | PRN
  Administered 2017-04-28: 0.5 mg via INTRAVENOUS
  Filled 2017-04-28: qty 1

## 2017-04-28 MED ORDER — SODIUM CHLORIDE 0.9 % IV SOLN
INTRAVENOUS | Status: DC
  Administered 2017-04-28: 09:00:00 via INTRAVENOUS

## 2017-04-28 MED ORDER — POTASSIUM CHLORIDE IN NACL 20-0.9 MEQ/L-% IV SOLN
INTRAVENOUS | Status: DC
  Administered 2017-04-28: 06:00:00 via INTRAVENOUS

## 2017-04-28 MED ORDER — SODIUM CHLORIDE 0.9 % IV BOLUS (SEPSIS)
500.0000 mL | Freq: Once | INTRAVENOUS | Status: AC
  Administered 2017-04-28: 500 mL via INTRAVENOUS

## 2017-04-28 MED ORDER — FUROSEMIDE 40 MG PO TABS: 40.0000 mg | ORAL_TABLET | Freq: Every day | ORAL | Status: AC

## 2017-04-28 MED ORDER — ACETAMINOPHEN 325 MG PO TABS: 650.0000 mg | ORAL_TABLET | Freq: Four times a day (QID) | ORAL | Status: DC | PRN

## 2017-04-28 MED ORDER — ALBUTEROL SULFATE (2.5 MG/3ML) 0.083% IN NEBU: 3.0000 mL | INHALATION_SOLUTION | RESPIRATORY_TRACT | Status: DC | PRN

## 2017-04-28 MED ORDER — INSULIN GLARGINE 100 UNIT/ML ~~LOC~~ SOLN
10.0000 [IU] | Freq: Two times a day (BID) | SUBCUTANEOUS | Status: DC
  Administered 2017-04-28 (×2): 10 [IU] via SUBCUTANEOUS
  Filled 2017-04-28 (×3): qty 0.1

## 2017-04-28 MED ORDER — PANTOPRAZOLE SODIUM 40 MG PO TBEC
40.0000 mg | DELAYED_RELEASE_TABLET | Freq: Every day | ORAL | Status: DC
  Administered 2017-04-28: 40 mg via ORAL
  Filled 2017-04-28: qty 1

## 2017-04-28 MED ORDER — ASPIRIN EC 81 MG PO TBEC
81.0000 mg | DELAYED_RELEASE_TABLET | Freq: Every day | ORAL | Status: DC
  Administered 2017-04-28: 81 mg via ORAL
  Filled 2017-04-28: qty 1

## 2017-04-28 MED ORDER — LANTUS SOLOSTAR 100 UNIT/ML ~~LOC~~ SOPN: PEN_INJECTOR | SUBCUTANEOUS | 11 refills | Status: AC

## 2017-04-28 MED ORDER — PANTOPRAZOLE SODIUM 40 MG PO TBEC: 40.0000 mg | DELAYED_RELEASE_TABLET | Freq: Every day | ORAL | 0 refills | Status: AC

## 2017-04-28 MED ORDER — SODIUM CHLORIDE 0.9 % IV SOLN
Freq: Once | INTRAVENOUS | Status: AC
  Administered 2017-04-28: 04:00:00 via INTRAVENOUS

## 2017-04-28 MED ORDER — INSULIN ASPART 100 UNIT/ML ~~LOC~~ SOLN
0.0000 [IU] | Freq: Three times a day (TID) | SUBCUTANEOUS | Status: DC
  Administered 2017-04-28: 7 [IU] via SUBCUTANEOUS
  Administered 2017-04-28: 3 [IU] via SUBCUTANEOUS
  Administered 2017-04-28: 7 [IU] via SUBCUTANEOUS

## 2017-04-28 NOTE — Discharge Summary (Addendum)
Physician Discharge Summary  [name] IRS:[id number] DOB: Apr 07, 1945 DOA: 04/27/2017  PCP: Gareth Morgan, MD  Admit date: 04/27/2017 Discharge date: 04/28/2017  Admitted From: Home Disposition: Home  Recommendations for Outpatient Follow-up:  1. Follow up with PCP in 1-2 weeks 2. Please obtain BMP/CBC in one week 3. Patient will need outpatient CT chest with contrast to evaluate right lower lobe lesion  Home Health: Equipment/Devices:  Discharge Condition:Stable CODE STATUS: Full code Diet recommendation: Heart Healthy / Carb Modified   Brief/Interim Summary: 72 year old male who presents to the hospital with complaints of abdominal pain, nausea and vomiting.  He was noted to be dehydrated and had acute kidney injury with a creatinine of 1.9.  He was also noted to be hypotensive, but responded to IV fluids.  He was monitored in the hospital continued on IV hydration.  CT scan of the abdomen and pelvis did not show any acute findings in the abdomen.  Liver enzymes are noted to be unremarkable.  He received antiemetics, but did not have any further vomiting in the hospital.  Diet was advanced and he is tolerating a solid diet.  Renal function the following morning showed a significant improvement of 1.3.  Blood pressures have since stabilized.  He continues to complain of some soreness in his abdomen, but has not had any vomiting or diarrhea.  He is tolerating solid diet and appears to be doing well.  He has not required any pain medication.  Diabetes uncontrolled with hyperglycemia.  A1c of 14.1, which was related to blood sugar levels in the hospital.  His basal Lantus dose of 10 units twice daily has been increased to 15 units twice daily.  He will need to follow-up with his primary care physician for further management.  Incidental finding of right lower lobe lesion on CT abdomen was noted.  This may be scarring versus underlying malignancy.  He would benefit from CT of the chest with  contrast to be done as an outpatient once renal function has further recovered.  Discharge Diagnoses:  Active Problems:   Diabetes mellitus (HCC)   HTN (hypertension)   AKI (acute kidney injury) (HCC)   Hypotension    Discharge Instructions  Discharge Instructions    Diet - low sodium heart healthy   Complete by:  As directed    Increase activity slowly   Complete by:  As directed      Allergies as of 04/28/2017   No Known Allergies     Medication List    TAKE these medications   albuterol 108 (90 Base) MCG/ACT inhaler Commonly known as:  PROVENTIL HFA;VENTOLIN HFA Inhale 2 puffs into the lungs every 4 (four) hours as needed for wheezing or shortness of breath. Shortness of breath   aspirin EC 81 MG tablet Take 81 mg by mouth daily.   furosemide 40 MG tablet Commonly known as:  LASIX Take 1 tablet (40 mg total) by mouth daily. Resume in 3 days What changed:  additional instructions   LANTUS SOLOSTAR 100 UNIT/ML Solostar Pen Generic drug:  Insulin Glargine INJECT 17 UNITS INTO THE SKIN TWICE DAILY  FOR DIABETES What changed:  See the new instructions.   metFORMIN 1000 MG tablet Commonly known as:  GLUCOPHAGE Take 1,000 mg by mouth 2 (two) times daily.   pantoprazole 40 MG tablet Commonly known as:  PROTONIX Take 1 tablet (40 mg total) by mouth daily. Start taking on:  04/29/2017   Potassium Chloride ER 20 MEQ Tbcr Take 20 mEq by  mouth daily.       No Known Allergies  Consultations:     Procedures/Studies: Ct Abdomen Pelvis Wo Contrast  Result Date: 04/28/2017 CLINICAL DATA:  Acute onset of generalized abdominal pain, nausea and vomiting. EXAM: CT ABDOMEN AND PELVIS WITHOUT CONTRAST TECHNIQUE: Multidetector CT imaging of the abdomen and pelvis was performed following the standard protocol without IV contrast. COMPARISON:  CT of the abdomen and pelvis performed 06/20/2015 FINDINGS: Lower chest: A mildly complex nodular opacity is noted at the right lung  base, the largest portion of which measures approximately 1.5 x 1.1 cm. This may simply reflect scarring, though malignancy cannot be excluded. Scarring is noted at the left lung base. Scattered coronary artery calcifications are seen. Hepatobiliary: The liver is unremarkable in appearance. The gallbladder is unremarkable in appearance. The common bile duct remains normal in caliber. Pancreas: The pancreas is within normal limits. Spleen: The spleen is unremarkable in appearance. Adrenals/Urinary Tract: The adrenal glands are unremarkable in appearance. Nonspecific perinephric stranding is noted bilaterally. There is no evidence of hydronephrosis. No renal or ureteral stones are identified. Stomach/Bowel: The stomach is unremarkable in appearance. The small bowel is within normal limits. The appendix is normal in caliber, without evidence of appendicitis. The colon is unremarkable in appearance. Vascular/Lymphatic: Scattered calcification is seen along the abdominal aorta and its branches. The abdominal aorta is otherwise grossly unremarkable. The inferior vena cava is grossly unremarkable. No retroperitoneal lymphadenopathy is seen. No pelvic sidewall lymphadenopathy is identified. Reproductive: The bladder is mildly distended. Mild bladder wall thickening is stable in appearance and likely reflects the patient's baseline. The prostate is normal in size. Other: No additional soft tissue abnormalities are seen. Musculoskeletal: No acute osseous abnormalities are identified. The visualized musculature is unremarkable in appearance. IMPRESSION: 1. No definite acute abnormality seen to explain the patient's symptoms. 2. Mildly complex nodular opacity at the right lung base, the largest portion of which measures 1.5 x 1.1 cm. This may simply reflect scarring, though malignancy cannot be excluded. CT of the chest would be helpful for further evaluation, on an elective nonemergent basis. 3. Scattered coronary artery  calcifications. Aortic Atherosclerosis (ICD10-I70.0). Electronically Signed   By: Roanna Raider M.D.   On: 04/28/2017 01:22   Dg Chest Portable 1 View  Result Date: 04/28/2017 CLINICAL DATA:  Weakness hypotensive EXAM: PORTABLE CHEST 1 VIEW COMPARISON:  01/16/2017 FINDINGS: Mild cardiomegaly. No acute consolidation or pleural effusion. Right-sided aortic arch. No pneumothorax. IMPRESSION: 1. Mild cardiomegaly without edema or infiltrate. 2. Right-sided aortic arch. Electronically Signed   By: Jasmine Pang M.D.   On: 04/28/2017 03:44      Subjective: No vomiting.  Tolerating solid food.  No diarrhea.  Continues to complain of some soreness in periumbilical area.  Discharge Exam: Vitals:   04/28/17 1547 04/28/17 1600  BP:  129/86  Pulse: 77 65  Resp:    Temp: 98.2 F (36.8 C)   SpO2: 100% 99%   Vitals:   04/28/17 1400 04/28/17 1500 04/28/17 1547 04/28/17 1600  BP: (!) 90/49 128/84  129/86  Pulse: 70 78 77 65  Resp:      Temp:   98.2 F (36.8 C)   TempSrc:   Oral   SpO2: 98% 99% 100% 99%  Weight:      Height:        General: Pt is alert, awake, not in acute distress Cardiovascular: RRR, S1/S2 +, no rubs, no gallops Respiratory: CTA bilaterally, no wheezing, no rhonchi Abdominal: Soft, minimal  tenderness and periumbilical region, ND, bowel sounds + Extremities: no edema, no cyanosis    The results of significant diagnostics from this hospitalization (including imaging, microbiology, ancillary and laboratory) are listed below for reference.     Microbiology: Recent Results (from the past 240 hour(s))  MRSA PCR Screening     Status: None   Collection Time: 04/28/17  5:48 AM  Result Value Ref Range Status   MRSA by PCR NEGATIVE NEGATIVE Final    Comment:        The GeneXpert MRSA Assay (FDA approved for NASAL specimens only), is one component of a comprehensive MRSA colonization surveillance program. It is not intended to diagnose MRSA infection nor to guide  or monitor treatment for MRSA infections. Performed at [facility], [address], [place], [place] [address]      Labs: BNP (last 3 results) No results for input(s): BNP in the last 8760 hours. Basic Metabolic Panel: Recent Labs  Lab 04/27/17 2315 04/27/17 2334 04/28/17 0647  NA 136 136 132*  K 3.3* 3.3* 4.1  CL 98* 96* 96*  CO2 25  --  24  GLUCOSE 85 82 283*  BUN 24* 22* 22*  CREATININE 1.89* 1.90* 1.35*  CALCIUM 8.9  --  8.5*   Liver Function Tests: Recent Labs  Lab 04/27/17 2315  AST 20  ALT 18  ALKPHOS 84  BILITOT 0.7  PROT 6.2*  ALBUMIN 3.5   No results for input(s): LIPASE, AMYLASE in the last 168 hours. No results for input(s): AMMONIA in the last 168 hours. CBC: Recent Labs  Lab 04/27/17 2315 04/27/17 2334 04/28/17 0647  WBC 8.4  --  9.0  NEUTROABS 5.6  --   --   HGB 13.0 13.3 12.5*  HCT 37.9* 39.0 36.9*  MCV 75.0*  --  75.5*  PLT 189  --  181   Cardiac Enzymes: No results for input(s): CKTOTAL, CKMB, CKMBINDEX, TROPONINI in the last 168 hours. BNP: Invalid input(s): POCBNP CBG: Recent Labs  Lab 04/27/17 2244 04/28/17 0802 04/28/17 1105 04/28/17 1547  GLUCAP 79 230* 333* 347*   D-Dimer No results for input(s): DDIMER in the last 72 hours. Hgb A1c Recent Labs    04/28/17 0647  HGBA1C 14.1*   Lipid Profile No results for input(s): CHOL, HDL, LDLCALC, TRIG, CHOLHDL, LDLDIRECT in the last 72 hours. Thyroid function studies No results for input(s): TSH, T4TOTAL, T3FREE, THYROIDAB in the last 72 hours.  Invalid input(s): FREET3 Anemia work up No results for input(s): VITAMINB12, FOLATE, FERRITIN, TIBC, IRON, RETICCTPCT in the last 72 hours. Urinalysis    Component Value Date/Time   COLORURINE AMBER (A) 04/27/2017 2308   APPEARANCEUR CLEAR 04/27/2017 2308   LABSPEC 1.011 04/27/2017 2308   PHURINE 5.0 04/27/2017 2308   GLUCOSEU 150 (A) 04/27/2017 2308   HGBUR NEGATIVE 04/27/2017 2308   BILIRUBINUR NEGATIVE 04/27/2017 2308    KETONESUR NEGATIVE 04/27/2017 2308   PROTEINUR NEGATIVE 04/27/2017 2308   UROBILINOGEN 0.2 04/02/2013 1607   NITRITE NEGATIVE 04/27/2017 2308   LEUKOCYTESUR NEGATIVE 04/27/2017 2308   Sepsis Labs Invalid input(s): PROCALCITONIN,  WBC,  LACTICIDVEN Microbiology Recent Results (from the past 240 hour(s))  MRSA PCR Screening     Status: None   Collection Time: 04/28/17  5:48 AM  Result Value Ref Range Status   MRSA by PCR NEGATIVE NEGATIVE Final    Comment:        The GeneXpert MRSA Assay (FDA approved for NASAL specimens only), is one component of a  comprehensive MRSA colonization surveillance program. It is not intended to diagnose MRSA infection nor to guide or monitor treatment for MRSA infections. Performed at [facility], [address] [place], [place] [address]      Time coordinating discharge: Over 30 minutes  SIGNED:   Erick Blinks, MD  [facility] 04/28/2017, 5:01 PM Pager   If 7PM-7AM, please contact night-coverage [email or web address] Password [email or web address]

## 2017-04-28 NOTE — ED Provider Notes (Signed)
[facility] EMERGENCY DEPARTMENT Provider Note   CSN: [id number] Arrival date & time: 04/27/17  2231     History   Chief Complaint Chief Complaint  Patient presents with  . Altered Mental Status    Hypotension    HPI Birch Farino is a 72 y.o. male.  The history is provided by the patient. No language interpreter was used.  Altered Mental Status   This is a new problem. The current episode started 12 to 24 hours ago. The problem has not changed since onset.Associated symptoms include weakness. His past medical history is significant for diabetes.    Past Medical History:  Diagnosis Date  . Bronchitis   . Diabetes mellitus   . GERD (gastroesophageal reflux disease)   . Hypertension   . Non-compliance   . [facility] (rheumatoid [facility]) [facility]     Patient Active Problem List   Diagnosis Date Noted  . Acute bronchitis with bronchospasm 11/30/2013  . COPD exacerbation (HCC) 11/30/2013  . Asthma exacerbation 11/30/2013  . Diabetes mellitus (HCC) 11/30/2013  . HTN (hypertension) 11/30/2013  . HLD (hyperlipidemia) 11/30/2013  . Acute on chronic diastolic heart failure (HCC) 11/30/2013    Past Surgical History:  Procedure Laterality Date  . ABDOMINAL SURGERY         Home Medications    Prior to Admission medications   Medication Sig Start Date End Date Taking? Authorizing Provider  albuterol (PROVENTIL HFA;VENTOLIN HFA) 108 (90 BASE) MCG/ACT inhaler Inhale 2 puffs into the lungs every 4 (four) hours as needed for wheezing or shortness of breath. Shortness of breath    [provider]  aspirin EC 81 MG tablet Take 81 mg by mouth daily.    [provider]  furosemide (LASIX) 40 MG tablet Take 40 mg by mouth daily.     [provider]  LANTUS SOLOSTAR 100 UNIT/ML Solostar Pen INJECT 10 UNITS INTO THE SKIN TWICE DAILY  FOR DIABETES 05/29/15   [provider]  metFORMIN (GLUCOPHAGE) 1000 MG tablet Take 1,000 mg by mouth 2 (two) times daily.     [provider]  Potassium Chloride ER 20 MEQ TBCR Take 20 mEq by mouth daily.     [provider]    Family History No family history on file.  Social History Social History   Tobacco Use  . Smoking status: Never Smoker  . Smokeless tobacco: Never Used  Substance Use Topics  . Alcohol use: No  . Drug use: No     Allergies   Patient has no known allergies.   Review of Systems Review of Systems  Neurological: Positive for weakness.  All other systems reviewed and are negative.    Physical Exam Updated Vital Signs BP 98/65   Pulse 75   Temp 97.6 F (36.4 C) (Rectal)   Resp (!) 30   SpO2 97%   Physical Exam  Constitutional: He appears well-developed and well-nourished.  HENT:  Head: Normocephalic and atraumatic.  Right Ear: External ear normal.  Left Ear: External ear normal.  Nose: Nose normal.  Mouth/Throat: Oropharynx is clear and moist.  Eyes: Conjunctivae are normal.  Neck: Neck supple.  Cardiovascular: Normal rate and regular rhythm.  No murmur heard. Pulmonary/Chest: Effort normal and breath sounds normal. No respiratory distress.  Abdominal: Soft. There is tenderness.  Musculoskeletal: He exhibits no edema.  Neurological: He is alert.  Skin: Skin is warm and dry.  Psychiatric: He has a normal mood and affect.  Nursing note and vitals reviewed.  ED Treatments / Results  Labs (all labs ordered are listed, but only abnormal results are displayed) Labs Reviewed  CBC WITH DIFFERENTIAL/PLATELET - Abnormal; Notable for the following components:      Result Value   HCT 37.9 (*)    MCV 75.0 (*)    MCH 25.7 (*)    All other components within normal limits  I-STAT CHEM 8, ED - Abnormal; Notable for the following components:   Potassium 3.3 (*)    Chloride 96 (*)    BUN 22 (*)    Creatinine, Ser 1.90 (*)    Calcium, Ion 1.12 (*)    All other components within normal limits  COMPREHENSIVE METABOLIC PANEL  URINALYSIS, ROUTINE  W REFLEX MICROSCOPIC  CBG MONITORING, ED  I-STAT TROPONIN, ED    EKG  EKG Interpretation None       Radiology No results found.  Procedures Procedures (including critical care time)  Medications Ordered in ED Medications - No data to display   Initial Impression / Assessment and Plan / ED Course  I have reviewed the triage vital signs and the nursing notes.  Pertinent labs & imaging results that were available during my care of the patient were reviewed by me and considered in my medical decision making (see chart for details).    MDM Pt had low blood pressure.  Pt given Iv fluids with improved blood pressure. Dr. Blinda Leatherwood in to see and examine.  Ct scan ordered.  Pt's care turned over to Dr. Blinda Leatherwood at 1:00am.   Final Clinical Impressions(s) / ED Diagnoses   Final diagnoses:  Dehydration  Hypotension due to hypovolemia    ED Discharge Orders    None    An After Visit Summary was printed and given to the patient.    Elson Areas, [place] 04/29/17 0053    Gilda Crease, MD 04/29/17 [phone]

## 2017-04-28 NOTE — ED Notes (Signed)
Emergency contact information [name] ([email or web address]) [phone].

## 2017-04-28 NOTE — Progress Notes (Signed)
Pt has orders for discharge. RN has attempted to call each person on the contact list 4x with no success. Will continue to try to reach family to come and pick up the pt.

## 2017-04-28 NOTE — Progress Notes (Signed)
Pt left to go home with family member Johnny. VS stable, pt discharge instructions given and explained to patient and family member. Pt taken outside by wheelchair, escorted by staff.

## 2017-04-28 NOTE — H&P (Signed)
TRH H&P    Patient Demographics:    [name], is a 72 y.o. male  MRN: [id number]  DOB - 05-19-1945  Admit Date - 04/27/2017  Referring MD/NP/PA: Dorthy Cooler  Outpatient Primary MD for the patient is Gareth Morgan, MD  Patient coming from: home  Chief complaint-abdominal pain   HPI:    [name]  is a 72 y.o. male, came to hospital with complaints of abdominal pain. Also complains of nausea and vomiting but denies diarrhea. Patient is a poor historian. Patient was initially hypotensive and responded to IV fluids in the ED. He denies chest pain, no shortness of breath. Denies coughing up any phlegm. CT of the abdomen and pelvis done in the ED showed no acute abnormality. Level showed acute kidney injury with creatinine 1.90, baseline creatinine less than one.     Review of systems:     All other systems reviewed and are negative.   With Past History of the following :    Past Medical History:  Diagnosis Date  . Bronchitis   . Diabetes mellitus   . GERD (gastroesophageal reflux disease)   . Hypertension   . Non-compliance   . RA (rheumatoid arthritis) (HCC)       Past Surgical History:  Procedure Laterality Date  . ABDOMINAL SURGERY        Social History:      Social History   Tobacco Use  . Smoking status: Never Smoker  . Smokeless tobacco: Never Used  Substance Use Topics  . Alcohol use: No       Family History :   Unobtainable as patient is a poor historian   Home Medications:   Prior to Admission medications   Medication Sig Start Date End Date Taking? Authorizing Provider  albuterol (PROVENTIL HFA;VENTOLIN HFA) 108 (90 BASE) MCG/ACT inhaler Inhale 2 puffs into the lungs every 4 (four) hours as needed for wheezing or shortness of breath. Shortness of breath    [provider]  aspirin EC 81 MG tablet Take 81 mg by mouth daily.    [provider]  furosemide (LASIX) 40 MG tablet Take 40 mg by mouth daily.     [provider]  LANTUS SOLOSTAR 100 UNIT/ML Solostar Pen INJECT 10 UNITS INTO THE SKIN TWICE DAILY  FOR DIABETES 05/29/15   [provider]  metFORMIN (GLUCOPHAGE) 1000 MG tablet Take 1,000 mg by mouth 2 (two) times daily.    [provider]  Potassium Chloride ER 20 MEQ TBCR Take 20 mEq by mouth daily.     [provider]     Allergies:    No Known Allergies   Physical Exam:   Vitals  Blood pressure (!) 88/56, pulse 82, temperature 97.6 F (36.4 C), temperature source Rectal, resp. rate 11, SpO2 98 %.  1.  General: appears in no acute distress  2. Psychiatric:  Intact judgement and  insight, awake alert, oriented x 3.  3. Neurologic: No focal neurological deficits, all cranial nerves intact.Strength 5/5 all 4 extremities, sensation intact  all 4 extremities, plantars down going.  4. Eyes :  anicteric sclerae, moist conjunctivae with no lid lag. PERRLA.  5. ENMT:  Oropharynx clear with moist mucous membranes and good dentition  6. Neck:  supple, no cervical lymphadenopathy appriciated, No thyromegaly  7. Respiratory : Normal respiratory effort, good air movement bilaterally,clear to  auscultation bilaterally  8. Cardiovascular : RRR, no gallops, rubs or murmurs, no leg edema  9. Gastrointestinal:  Positive bowel sounds, abdomen soft, tender to palpation in the umbilical region ,no hepatosplenomegaly, no rigidity or guarding       10. Skin:  No cyanosis, normal texture and turgor, no rash, lesions or ulcers  11.Musculoskeletal:  Good muscle tone,  joints appear normal , no effusions,  normal range of motion    Data Review:    CBC Recent Labs  Lab 04/27/17 2315 04/27/17 2334  WBC 8.4  --   HGB 13.0 13.3  HCT 37.9* 39.0  PLT 189  --   MCV 75.0*  --   MCH 25.7*  --   MCHC 34.3  --   RDW 13.6  --   LYMPHSABS 2.1  --   MONOABS 0.5  --   EOSABS  0.0  --   BASOSABS 0.0  --    ------------------------------------------------------------------------------------------------------------------  Chemistries  Recent Labs  Lab 04/27/17 2315 04/27/17 2334  NA 136 136  K 3.3* 3.3*  CL 98* 96*  CO2 25  --   GLUCOSE 85 82  BUN 24* 22*  CREATININE 1.89* 1.90*  CALCIUM 8.9  --   AST 20  --   ALT 18  --   ALKPHOS 84  --   BILITOT 0.7  --    ------------------------------------------------------------------------------------------------------------------  ------------------------------------------------------------------------------------------------------------------ GFR: CrCl cannot be calculated (Unknown ideal weight.). Liver Function Tests: Recent Labs  Lab 04/27/17 2315  AST 20  ALT 18  ALKPHOS 84  BILITOT 0.7  PROT 6.2*  ALBUMIN 3.5   CBG: Recent Labs  Lab 04/27/17 2244  GLUCAP 79    --------------------------------------------------------------------------------------------------------------- Urine analysis:    Component Value Date/Time   COLORURINE AMBER (A) 04/27/2017 2308   APPEARANCEUR CLEAR 04/27/2017 2308   LABSPEC 1.011 04/27/2017 2308   PHURINE 5.0 04/27/2017 2308   GLUCOSEU 150 (A) 04/27/2017 2308   HGBUR NEGATIVE 04/27/2017 2308   BILIRUBINUR NEGATIVE 04/27/2017 2308   KETONESUR NEGATIVE 04/27/2017 2308   PROTEINUR NEGATIVE 04/27/2017 2308   UROBILINOGEN 0.2 04/02/2013 1607   NITRITE NEGATIVE 04/27/2017 2308   LEUKOCYTESUR NEGATIVE 04/27/2017 2308      Imaging Results:    Ct Abdomen Pelvis Wo Contrast  Result Date: 04/28/2017 CLINICAL DATA:  Acute onset of generalized abdominal pain, nausea and vomiting. EXAM: CT ABDOMEN AND PELVIS WITHOUT CONTRAST TECHNIQUE: Multidetector CT imaging of the abdomen and pelvis was performed following the standard protocol without IV contrast. COMPARISON:  CT of the abdomen and pelvis performed 06/20/2015 FINDINGS: Lower chest: A mildly complex nodular  opacity is noted at the right lung base, the largest portion of which measures approximately 1.5 x 1.1 cm. This may simply reflect scarring, though malignancy cannot be excluded. Scarring is noted at the left lung base. Scattered coronary artery calcifications are seen. Hepatobiliary: The liver is unremarkable in appearance. The gallbladder is unremarkable in appearance. The common bile duct remains normal in caliber. Pancreas: The pancreas is within normal limits. Spleen: The spleen is unremarkable in appearance. Adrenals/Urinary Tract: The adrenal glands are unremarkable in appearance. Nonspecific perinephric stranding is noted bilaterally. There is no  evidence of hydronephrosis. No renal or ureteral stones are identified. Stomach/Bowel: The stomach is unremarkable in appearance. The small bowel is within normal limits. The appendix is normal in caliber, without evidence of appendicitis. The colon is unremarkable in appearance. Vascular/Lymphatic: Scattered calcification is seen along the abdominal aorta and its branches. The abdominal aorta is otherwise grossly unremarkable. The inferior vena cava is grossly unremarkable. No retroperitoneal lymphadenopathy is seen. No pelvic sidewall lymphadenopathy is identified. Reproductive: The bladder is mildly distended. Mild bladder wall thickening is stable in appearance and likely reflects the patient's baseline. The prostate is normal in size. Other: No additional soft tissue abnormalities are seen. Musculoskeletal: No acute osseous abnormalities are identified. The visualized musculature is unremarkable in appearance. IMPRESSION: 1. No definite acute abnormality seen to explain the patient's symptoms. 2. Mildly complex nodular opacity at the right lung base, the largest portion of which measures 1.5 x 1.1 cm. This may simply reflect scarring, though malignancy cannot be excluded. CT of the chest would be helpful for further evaluation, on an elective nonemergent basis.  3. Scattered coronary artery calcifications. Aortic Atherosclerosis (ICD10-I70.0). Electronically Signed   By: Roanna Raider M.D.   On: 04/28/2017 01:22   Dg Chest Portable 1 View  Result Date: 04/28/2017 CLINICAL DATA:  Weakness hypotensive EXAM: PORTABLE CHEST 1 VIEW COMPARISON:  01/16/2017 FINDINGS: Mild cardiomegaly. No acute consolidation or pleural effusion. Right-sided aortic arch. No pneumothorax. IMPRESSION: 1. Mild cardiomegaly without edema or infiltrate. 2. Right-sided aortic arch. Electronically Signed   By: Jasmine Pang M.D.   On: 04/28/2017 03:44    My personal review of EKG: Rhythm NSR   Assessment & Plan:    Active Problems:   AKI (acute kidney injury) (HCC)   1. Acute kidney injury-patient's creatinine is elevated 1.90, baseline creatinine less than one. Likely from nausea vomiting, also on furosemide. Will hold Lasix at this time. Continue IV normal saline. Zofran PRN for nausea and vomiting. 2. Nausea vomiting abdominal pain- unclear etiology, CT of the abdomen is negative for acute pathology. 3. Diabetes mellitus-continue Lantus 10 units sub Q twice a day, initiate sliding scale insulin with NovoLog. 4. History of CHF- euvolemic hold Lasix.    DVT Prophylaxis-   Lovenox   AM Labs Ordered, also please review Full Orders  Family Communication: Admission, patients condition and plan of care including tests being ordered have been discussed with the patient  who indicate understanding and agree with the plan and Code Status.  Code Status: full code  Admission status: observation  Time spent in minutes : 50 minutes   Meredeth Ide M.D on 04/28/2017 at 4:44 AM  Between 7am to 7pm - Pager - [phone]. After 7pm go to [email or web address] - password [facility]  [facility] - Office  [phone]

## 2017-04-28 NOTE — Progress Notes (Signed)
Patient admitted to the hospital earlier this morning by Dr. Sharl Ma.  Patient seen and examined.  No further vomiting.  No diarrhea since admission.  Has some abdominal discomfort, but is better than it was on admission.  Tolerating clear liquids.  Wants to advance diet.     Admitted to the hospital with abdominal discomfort, vomiting, acute kidney injury and dehydration.  Creatinine has improved with IV hydration.  Nausea and vomiting have resolved and is tolerating clear liquids.  Advance diet as tolerated.  CT scan of the abdomen pelvis did not show any acute findings in the abdomen.  Continue on IV fluids for now.  Advance diet.  If he is able to tolerate a solid diet, anticipate discharge later this afternoon.  [employer]

## 2019-05-20 ENCOUNTER — Ambulatory Visit: Payer: Medicare Other | Attending: Internal Medicine

## 2019-05-20 DIAGNOSIS — Z23 Encounter for immunization: Secondary | ICD-10-CM | POA: Insufficient documentation

## 2019-05-20 NOTE — Progress Notes (Signed)
   Covid-19 Vaccination Clinic  Name:  Eulon Allnutt    MRN: [id number] DOB: September 27, 1945  05/20/2019  Mr. Chay was observed post Covid-19 immunization for 15 minutes without incidence. He was provided with Vaccine Information Sheet and instruction to access the V-Safe system.   Mr. Tuazon was instructed to call 911 with any severe reactions post vaccine: Marland Kitchen Difficulty breathing  . Swelling of your face and throat  . A fast heartbeat  . A bad rash all over your body  . Dizziness and weakness    Immunizations Administered    Name Date Dose VIS Date Route   Moderna COVID-19 Vaccine 05/20/2019  1:49 PM 0.5 mL 02/20/2019 Intramuscular   Manufacturer: Moderna   Lot: [id number]   NDC: 12524-799-80

## 2019-05-29 DIAGNOSIS — E7800 Pure hypercholesterolemia, unspecified: Secondary | ICD-10-CM | POA: Diagnosis not present

## 2019-05-29 DIAGNOSIS — Z23 Encounter for immunization: Secondary | ICD-10-CM | POA: Diagnosis not present

## 2019-05-29 DIAGNOSIS — E1165 Type 2 diabetes mellitus with hyperglycemia: Secondary | ICD-10-CM | POA: Diagnosis not present

## 2019-05-29 DIAGNOSIS — I10 Essential (primary) hypertension: Secondary | ICD-10-CM | POA: Diagnosis not present

## 2019-06-05 DIAGNOSIS — E1165 Type 2 diabetes mellitus with hyperglycemia: Secondary | ICD-10-CM | POA: Diagnosis not present

## 2019-06-05 DIAGNOSIS — Z9181 History of falling: Secondary | ICD-10-CM | POA: Diagnosis not present

## 2019-06-05 DIAGNOSIS — Z7982 Long term (current) use of aspirin: Secondary | ICD-10-CM | POA: Diagnosis not present

## 2019-06-05 DIAGNOSIS — I10 Essential (primary) hypertension: Secondary | ICD-10-CM | POA: Diagnosis not present

## 2019-06-05 DIAGNOSIS — Z79899 Other long term (current) drug therapy: Secondary | ICD-10-CM | POA: Diagnosis not present

## 2019-06-05 DIAGNOSIS — E7800 Pure hypercholesterolemia, unspecified: Secondary | ICD-10-CM | POA: Diagnosis not present

## 2019-06-05 DIAGNOSIS — Z7984 Long term (current) use of oral hypoglycemic drugs: Secondary | ICD-10-CM | POA: Diagnosis not present

## 2019-06-08 DIAGNOSIS — Z9181 History of falling: Secondary | ICD-10-CM | POA: Diagnosis not present

## 2019-06-08 DIAGNOSIS — Z79899 Other long term (current) drug therapy: Secondary | ICD-10-CM | POA: Diagnosis not present

## 2019-06-08 DIAGNOSIS — Z7982 Long term (current) use of aspirin: Secondary | ICD-10-CM | POA: Diagnosis not present

## 2019-06-08 DIAGNOSIS — Z7984 Long term (current) use of oral hypoglycemic drugs: Secondary | ICD-10-CM | POA: Diagnosis not present

## 2019-06-08 DIAGNOSIS — I10 Essential (primary) hypertension: Secondary | ICD-10-CM | POA: Diagnosis not present

## 2019-06-08 DIAGNOSIS — E7800 Pure hypercholesterolemia, unspecified: Secondary | ICD-10-CM | POA: Diagnosis not present

## 2019-06-08 DIAGNOSIS — E1165 Type 2 diabetes mellitus with hyperglycemia: Secondary | ICD-10-CM | POA: Diagnosis not present

## 2019-06-08 DIAGNOSIS — N390 Urinary tract infection, site not specified: Secondary | ICD-10-CM | POA: Diagnosis not present

## 2019-06-12 DIAGNOSIS — Z7982 Long term (current) use of aspirin: Secondary | ICD-10-CM | POA: Diagnosis not present

## 2019-06-12 DIAGNOSIS — Z7984 Long term (current) use of oral hypoglycemic drugs: Secondary | ICD-10-CM | POA: Diagnosis not present

## 2019-06-12 DIAGNOSIS — E1165 Type 2 diabetes mellitus with hyperglycemia: Secondary | ICD-10-CM | POA: Diagnosis not present

## 2019-06-12 DIAGNOSIS — R7309 Other abnormal glucose: Secondary | ICD-10-CM | POA: Diagnosis not present

## 2019-06-12 DIAGNOSIS — E7800 Pure hypercholesterolemia, unspecified: Secondary | ICD-10-CM | POA: Diagnosis not present

## 2019-06-12 DIAGNOSIS — Z79899 Other long term (current) drug therapy: Secondary | ICD-10-CM | POA: Diagnosis not present

## 2019-06-12 DIAGNOSIS — I10 Essential (primary) hypertension: Secondary | ICD-10-CM | POA: Diagnosis not present

## 2019-06-12 DIAGNOSIS — Z9181 History of falling: Secondary | ICD-10-CM | POA: Diagnosis not present

## 2019-06-14 ENCOUNTER — Encounter (HOSPITAL_COMMUNITY): Payer: Self-pay | Admitting: *Deleted

## 2019-06-14 ENCOUNTER — Other Ambulatory Visit: Payer: Self-pay

## 2019-06-14 ENCOUNTER — Emergency Department (HOSPITAL_COMMUNITY)
Admission: EM | Admit: 2019-06-14 | Discharge: 2019-06-14 | Disposition: A | Discharge status: Home / Self Care | Payer: Medicare Other | Attending: Emergency Medicine | Admitting: Emergency Medicine

## 2019-06-14 DIAGNOSIS — Z794 Long term (current) use of insulin: Secondary | ICD-10-CM | POA: Insufficient documentation

## 2019-06-14 DIAGNOSIS — R739 Hyperglycemia, unspecified: Secondary | ICD-10-CM | POA: Diagnosis present

## 2019-06-14 DIAGNOSIS — Z9119 Patient's noncompliance with other medical treatment and regimen: Secondary | ICD-10-CM | POA: Diagnosis not present

## 2019-06-14 DIAGNOSIS — I110 Hypertensive heart disease with heart failure: Secondary | ICD-10-CM | POA: Insufficient documentation

## 2019-06-14 DIAGNOSIS — Z9114 Patient's other noncompliance with medication regimen: Secondary | ICD-10-CM

## 2019-06-14 DIAGNOSIS — J449 Chronic obstructive pulmonary disease, unspecified: Secondary | ICD-10-CM | POA: Diagnosis not present

## 2019-06-14 DIAGNOSIS — E1165 Type 2 diabetes mellitus with hyperglycemia: Secondary | ICD-10-CM | POA: Insufficient documentation

## 2019-06-14 DIAGNOSIS — I5030 Unspecified diastolic (congestive) heart failure: Secondary | ICD-10-CM | POA: Diagnosis not present

## 2019-06-14 DIAGNOSIS — Z7982 Long term (current) use of aspirin: Secondary | ICD-10-CM | POA: Diagnosis not present

## 2019-06-14 DIAGNOSIS — Z79899 Other long term (current) drug therapy: Secondary | ICD-10-CM | POA: Diagnosis not present

## 2019-06-14 LAB — CBC
HCT: 38.2 % — ABNORMAL LOW (ref 39.0–52.0)
Hemoglobin: 12.7 g/dL — ABNORMAL LOW (ref 13.0–17.0)
MCH: 26.3 pg (ref 26.0–34.0)
MCHC: 33.2 g/dL (ref 30.0–36.0)
MCV: 79.1 fL — ABNORMAL LOW (ref 80.0–100.0)
Platelets: 236 10*3/uL (ref 150–400)
RBC: 4.83 MIL/uL (ref 4.22–5.81)
RDW: 13.8 % (ref 11.5–15.5)
WBC: 4.5 10*3/uL (ref 4.0–10.5)
nRBC: 0 % (ref 0.0–0.2)

## 2019-06-14 LAB — BASIC METABOLIC PANEL
Anion gap: 10 (ref 5–15)
BUN: 24 mg/dL — ABNORMAL HIGH (ref 8–23)
CO2: 30 mmol/L (ref 22–32)
Calcium: 9.5 mg/dL (ref 8.9–10.3)
Chloride: 94 mmol/L — ABNORMAL LOW (ref 98–111)
Creatinine, Ser: 1.31 mg/dL — ABNORMAL HIGH (ref 0.61–1.24)
GFR calc Af Amer: >60 mL/min (ref >60)
GFR calc non Af Amer: 54 mL/min — ABNORMAL LOW (ref >60)
Glucose, Bld: 291 mg/dL — ABNORMAL HIGH (ref 70–99)
Potassium: 4.2 mmol/L (ref 3.5–5.1)
Sodium: 134 mmol/L — ABNORMAL LOW (ref 135–145)

## 2019-06-14 LAB — CBG MONITORING, ED
Glucose-Capillary: 310 mg/dL — ABNORMAL HIGH (ref 70–99)
Glucose-Capillary: 291 mg/dL — ABNORMAL HIGH (ref 70–99)
Glucose-Capillary: 244 mg/dL — ABNORMAL HIGH (ref 70–99)
Glucose-Capillary: 216 mg/dL — ABNORMAL HIGH (ref 70–99)

## 2019-06-14 MED ORDER — INSULIN ASPART 100 UNIT/ML ~~LOC~~ SOLN
4.0000 [IU] | Freq: Once | SUBCUTANEOUS | Status: AC
  Administered 2019-06-14: 4 [IU] via SUBCUTANEOUS
  Filled 2019-06-14: qty 1

## 2019-06-14 MED ORDER — SODIUM CHLORIDE 0.9 % IV BOLUS
1000.0000 mL | Freq: Once | INTRAVENOUS | Status: AC
  Administered 2019-06-14: 1000 mL via INTRAVENOUS

## 2019-06-14 NOTE — ED Provider Notes (Signed)
[facility] EMERGENCY DEPARTMENT Provider Note   CSN: [id number] Arrival date & time: 06/14/19  1132     History Chief Complaint  Patient presents with  . Hyperglycemia    [name] is a 74 y.o. male.  HPI Patient presents with hyperglycemia.  States he checked his sugar at home was 150.  Has had some urinary frequency and getting up at night.  States he feels a little weak all over.  States he is worried because that is very elevated.  States has been out of his insulin injection for around a week.  Sounds like he does not have injection supplies also.  Has had some weight loss.  No abdominal pain no nausea or vomiting.  No fevers or chills.    Past Medical History:  Diagnosis Date  . Bronchitis   . Diabetes mellitus   . GERD (gastroesophageal reflux disease)   . Hypertension   . Non-compliance   . RA (rheumatoid arthritis) [facility])     Patient Active Problem List   Diagnosis Date Noted  . AKI (acute kidney injury) ([place]) 04/28/2017  . Hypotension 04/28/2017  . Acute bronchitis with bronchospasm 11/30/2013  . COPD exacerbation ([place]) 11/30/2013  . Asthma exacerbation 11/30/2013  . Diabetes mellitus ([place]) 11/30/2013  . HTN (hypertension) 11/30/2013  . HLD (hyperlipidemia) 11/30/2013  . Acute on chronic diastolic heart failure ([place]) 11/30/2013    Past Surgical History:  Procedure Laterality Date  . ABDOMINAL SURGERY         History reviewed. No pertinent family history.  Social History   Tobacco Use  . Smoking status: Never Smoker  . Smokeless tobacco: Never Used  Substance Use Topics  . Alcohol use: No  . Drug use: No    Home Medications Prior to Admission medications   Medication Sig Start Date End Date Taking? Authorizing Provider  albuterol (PROVENTIL HFA;VENTOLIN HFA) 108 (90 BASE) MCG/ACT inhaler Inhale 2 puffs into the lungs every 4 (four) hours as needed for wheezing or shortness of breath. Shortness of breath    [provider]    aspirin EC 81 MG tablet Take 81 mg by mouth daily.    [provider]  furosemide (LASIX) 40 MG tablet Take 1 tablet (40 mg total) by mouth daily. Resume in 3 days 04/28/17   Kathie Dike, MD  LANTUS SOLOSTAR 100 UNIT/ML Solostar Pen INJECT 17 UNITS INTO THE SKIN TWICE DAILY  FOR DIABETES 04/28/17   Kathie Dike, MD  metFORMIN (GLUCOPHAGE) 1000 MG tablet Take 1,000 mg by mouth 2 (two) times daily.    [provider]  pantoprazole (PROTONIX) 40 MG tablet Take 1 tablet (40 mg total) by mouth daily. 04/29/17   Kathie Dike, MD  Potassium Chloride ER 20 MEQ TBCR Take 20 mEq by mouth daily.     [provider]    Allergies    Patient has no known allergies.  Review of Systems   Review of Systems  Constitutional: Positive for unexpected weight change. Negative for appetite change.  HENT: Negative for congestion.   Respiratory: Negative for shortness of breath.   Gastrointestinal: Negative for abdominal pain, nausea and vomiting.  Genitourinary: Positive for frequency.  Musculoskeletal: Negative for back pain.  Skin: Negative for rash.  Neurological: Negative for weakness.  Psychiatric/Behavioral: Negative for behavioral problems.    Physical Exam Updated Vital Signs BP (!) 163/87 (BP Location: Right Arm)   Pulse 81   Temp 98.5 F (36.9 C) (Oral)   Resp 14  Wt 77.6 kg   SpO2 99%   BMI 24.54 kg/m   Physical Exam Vitals and nursing note reviewed.  HENT:     Head: Normocephalic.  Eyes:     Pupils: Pupils are equal, round, and reactive to light.  Cardiovascular:     Rate and Rhythm: Regular rhythm.  Pulmonary:     Breath sounds: No wheezing, rhonchi or rales.  Abdominal:     Tenderness: There is no abdominal tenderness.  Skin:    General: Skin is warm.     Capillary Refill: Capillary refill takes less than 2 seconds.  Neurological:     General: No focal deficit present.     Mental Status: He is alert.     ED Results / Procedures /  Treatments   Labs (all labs ordered are listed, but only abnormal results are displayed) Labs Reviewed  BASIC METABOLIC PANEL - Abnormal; Notable for the following components:      Result Value   Sodium 134 (*)    Chloride 94 (*)    Glucose, Bld 291 (*)    BUN 24 (*)    Creatinine, Ser 1.31 (*)    GFR calc non Af Amer 54 (*)    All other components within normal limits  CBC - Abnormal; Notable for the following components:   Hemoglobin 12.7 (*)    HCT 38.2 (*)    MCV 79.1 (*)    All other components within normal limits  CBG MONITORING, ED - Abnormal; Notable for the following components:   Glucose-Capillary 310 (*)    All other components within normal limits  CBG MONITORING, ED - Abnormal; Notable for the following components:   Glucose-Capillary 291 (*)    All other components within normal limits  CBG MONITORING, ED - Abnormal; Notable for the following components:   Glucose-Capillary 244 (*)    All other components within normal limits  CBG MONITORING, ED - Abnormal; Notable for the following components:   Glucose-Capillary 216 (*)    All other components within normal limits    EKG None  Radiology No results found.  Procedures Procedures (including critical care time)  Medications Ordered in ED Medications  sodium chloride 0.9 % bolus 1,000 mL (1,000 mLs Intravenous New Bag/Given 06/14/19 1549)  insulin aspart (novoLOG) injection 4 Units (4 Units Subcutaneous Given 06/14/19 1719)    ED Course  I have reviewed the triage vital signs and the nursing notes.  Pertinent labs & imaging results that were available during my care of the patient were reviewed by me and considered in my medical decision making (see chart for details).    MDM Rules/Calculators/A&P                      Patient with hyperglycemia.  Not in DKA.  Had been out of his insulin.  Transitional care saw patient and get matching to hopefully help him get his medications.  Has a refills of his  medications at the pharmacy.  Sugars come down some.  Will discharge home. Final Clinical Impression(s) / ED Diagnoses Final diagnoses:  Hyperglycemia  Noncompliance with medication regimen    Rx / DC Orders ED Discharge Orders    None       Benjiman Core, MD 06/14/19 1843

## 2019-06-14 NOTE — Progress Notes (Signed)
Consult request has been received. CSW attempting to follow up at present time  Tristina Sahagian M. Soniyah Mcglory LCSWA Transitions of Care  Clinical Social Worker  Ph: [phone] 

## 2019-06-14 NOTE — Progress Notes (Signed)
Transition of Care [facility]) - Emergency Department Mini Assessment   Patient Details  Name: [name] MRN: [id number] Date of Birth: [birth year]  Transition of Care [facility]) CM/SW Contact:    Rogerio Boutelle Sherryle Lis, LCSW Phone Number: 06/14/2019, 4:11 PM   Clinical Narrative:    ED Mini Assessment: What brought you to the Emergency Department? : medication needs Barriers to Discharge: No Barriers Identified Barrier interventions: MATCH letter provided by 2nd shift CSW Means of departure: Car   Patient Contact and Communications Key Contact 1: Nena Polio PH: [phone]   Spoke with: patient Contact Date: 06/14/19,   Contact time: 1610 Contact Phone Number: mobile number [facility] [phone] Call outcome: patient being discharged with [facility] letter; patient listed his pharmacy as [facility] in [place] [address]  Patient states their goals for this hospitalization and ongoing recovery are:: to obtain medications and return home CMS Medicare.gov Compare Post Acute Care list provided to:: Other (Comment Required) Choice offered to / list presented to : NA  Admission diagnosis:  Hyperglycemia Patient Active Problem List   Diagnosis Date Noted  . AKI (acute kidney injury) (HCC) 04/28/2017  . Hypotension 04/28/2017  . Acute bronchitis with bronchospasm 11/30/2013  . COPD exacerbation (HCC) 11/30/2013  . Asthma exacerbation 11/30/2013  . Diabetes mellitus (HCC) 11/30/2013  . HTN (hypertension) 11/30/2013  . HLD (hyperlipidemia) 11/30/2013  . Acute on chronic diastolic heart failure (HCC) 11/30/2013   PCP:  Gareth Morgan, MD Pharmacy:   [phone] - [place], [place] - [address] [place] [address] Phone: [phone] Fax: [phone]

## 2019-06-14 NOTE — ED Triage Notes (Signed)
Pt checked his CBG and resulted 135 per pt. Denies any other symptoms.

## 2019-06-14 NOTE — Discharge Instructions (Addendum)
Will the insulin prescription at the pharmacy.  There should be refills there.  The sheet you were given should help with the cost.

## 2019-06-18 DIAGNOSIS — E7800 Pure hypercholesterolemia, unspecified: Secondary | ICD-10-CM | POA: Diagnosis not present

## 2019-06-18 DIAGNOSIS — Z9181 History of falling: Secondary | ICD-10-CM | POA: Diagnosis not present

## 2019-06-18 DIAGNOSIS — Z7982 Long term (current) use of aspirin: Secondary | ICD-10-CM | POA: Diagnosis not present

## 2019-06-18 DIAGNOSIS — Z79899 Other long term (current) drug therapy: Secondary | ICD-10-CM | POA: Diagnosis not present

## 2019-06-18 DIAGNOSIS — E1165 Type 2 diabetes mellitus with hyperglycemia: Secondary | ICD-10-CM | POA: Diagnosis not present

## 2019-06-18 DIAGNOSIS — Z7984 Long term (current) use of oral hypoglycemic drugs: Secondary | ICD-10-CM | POA: Diagnosis not present

## 2019-06-18 DIAGNOSIS — I10 Essential (primary) hypertension: Secondary | ICD-10-CM | POA: Diagnosis not present

## 2019-06-23 ENCOUNTER — Ambulatory Visit: Payer: Medicare Other | Attending: Internal Medicine

## 2019-06-23 DIAGNOSIS — Z23 Encounter for immunization: Secondary | ICD-10-CM

## 2019-06-23 NOTE — Progress Notes (Signed)
   Covid-19 Vaccination Clinic  Name:  Kreig Parson    MRN: [id number] DOB: 1945-06-07  06/23/2019  Mr. Vanvleck was observed post Covid-19 immunization for 15 minutes without incident. He was provided with Vaccine Information Sheet and instruction to access the V-Safe system.   Mr. Locker was instructed to call 911 with any severe reactions post vaccine: Marland Kitchen Difficulty breathing  . Swelling of face and throat  . A fast heartbeat  . A bad rash all over body  . Dizziness and weakness   Immunizations Administered    Name Date Dose VIS Date Route   Moderna COVID-19 Vaccine 06/23/2019  1:43 PM 0.5 mL 02/20/2019 Intramuscular   Manufacturer: Moderna   Lot: [id number]   NDC: 30092-330-07

## 2019-06-28 DIAGNOSIS — I10 Essential (primary) hypertension: Secondary | ICD-10-CM | POA: Diagnosis not present

## 2019-06-28 DIAGNOSIS — Z7984 Long term (current) use of oral hypoglycemic drugs: Secondary | ICD-10-CM | POA: Diagnosis not present

## 2019-06-28 DIAGNOSIS — Z9181 History of falling: Secondary | ICD-10-CM | POA: Diagnosis not present

## 2019-06-28 DIAGNOSIS — E7800 Pure hypercholesterolemia, unspecified: Secondary | ICD-10-CM | POA: Diagnosis not present

## 2019-06-28 DIAGNOSIS — E1165 Type 2 diabetes mellitus with hyperglycemia: Secondary | ICD-10-CM | POA: Diagnosis not present

## 2019-06-28 DIAGNOSIS — Z79899 Other long term (current) drug therapy: Secondary | ICD-10-CM | POA: Diagnosis not present

## 2019-06-28 DIAGNOSIS — Z7982 Long term (current) use of aspirin: Secondary | ICD-10-CM | POA: Diagnosis not present

## 2019-07-18 DIAGNOSIS — L03031 Cellulitis of right toe: Secondary | ICD-10-CM | POA: Diagnosis not present
